# Patient Record
Sex: Female | Born: 1950 | Race: White | Hispanic: No | Marital: Married | Smoking: Former smoker
Health system: Southern US, Community
[De-identification: ages and names within clinical notes are randomized; demographics above are authoritative.]

## PROBLEM LIST (undated history)

## (undated) DIAGNOSIS — R0602 Shortness of breath: Secondary | ICD-10-CM

## (undated) DIAGNOSIS — J449 Chronic obstructive pulmonary disease, unspecified: Secondary | ICD-10-CM

## (undated) DIAGNOSIS — E039 Hypothyroidism, unspecified: Secondary | ICD-10-CM

## (undated) DIAGNOSIS — E7800 Pure hypercholesterolemia, unspecified: Secondary | ICD-10-CM

## (undated) DIAGNOSIS — K429 Umbilical hernia without obstruction or gangrene: Secondary | ICD-10-CM

## (undated) DIAGNOSIS — H9190 Unspecified hearing loss, unspecified ear: Secondary | ICD-10-CM

## (undated) DIAGNOSIS — I10 Essential (primary) hypertension: Secondary | ICD-10-CM

## (undated) DIAGNOSIS — Z720 Tobacco use: Secondary | ICD-10-CM

## (undated) DIAGNOSIS — M1612 Unilateral primary osteoarthritis, left hip: Secondary | ICD-10-CM

## (undated) DIAGNOSIS — R918 Other nonspecific abnormal finding of lung field: Secondary | ICD-10-CM

## (undated) DIAGNOSIS — R0600 Dyspnea, unspecified: Secondary | ICD-10-CM

## (undated) HISTORY — DX: Tobacco use: Z72.0

## (undated) HISTORY — PX: EYE SURGERY: SHX253

## (undated) HISTORY — DX: Other nonspecific abnormal finding of lung field: R91.8

## (undated) HISTORY — DX: Chronic obstructive pulmonary disease, unspecified: J44.9

## (undated) HISTORY — DX: Essential (primary) hypertension: I10

## (undated) HISTORY — PX: COLONOSCOPY: SHX174

---

## 1992-10-11 HISTORY — PX: TUBAL LIGATION: SHX77

## 2006-10-11 DIAGNOSIS — C801 Malignant (primary) neoplasm, unspecified: Secondary | ICD-10-CM

## 2006-10-11 DIAGNOSIS — R918 Other nonspecific abnormal finding of lung field: Secondary | ICD-10-CM

## 2006-10-11 HISTORY — DX: Other nonspecific abnormal finding of lung field: R91.8

## 2006-10-11 HISTORY — DX: Malignant (primary) neoplasm, unspecified: C80.1

## 2007-07-20 ENCOUNTER — Ambulatory Visit: Payer: Self-pay | Admitting: Thoracic Surgery

## 2007-07-24 ENCOUNTER — Ambulatory Visit (HOSPITAL_COMMUNITY): Admission: RE | Admit: 2007-07-24 | Discharge: 2007-07-24 | Payer: Self-pay | Admitting: Thoracic Surgery

## 2007-07-31 ENCOUNTER — Encounter: Payer: Self-pay | Admitting: Thoracic Surgery

## 2007-07-31 ENCOUNTER — Ambulatory Visit: Payer: Self-pay | Admitting: Thoracic Surgery

## 2007-07-31 ENCOUNTER — Inpatient Hospital Stay (HOSPITAL_COMMUNITY): Admission: AD | Admit: 2007-07-31 | Discharge: 2007-08-11 | Payer: Self-pay | Admitting: Thoracic Surgery

## 2007-07-31 HISTORY — PX: OTHER SURGICAL HISTORY: SHX169

## 2007-08-04 ENCOUNTER — Encounter: Payer: Self-pay | Admitting: Thoracic Surgery

## 2007-08-04 HISTORY — PX: OTHER SURGICAL HISTORY: SHX169

## 2007-08-14 ENCOUNTER — Ambulatory Visit: Payer: Self-pay | Admitting: Thoracic Surgery (Cardiothoracic Vascular Surgery)

## 2007-08-14 ENCOUNTER — Encounter
Admission: RE | Admit: 2007-08-14 | Discharge: 2007-08-14 | Payer: Self-pay | Admitting: Thoracic Surgery (Cardiothoracic Vascular Surgery)

## 2007-08-18 ENCOUNTER — Encounter: Admission: RE | Admit: 2007-08-18 | Discharge: 2007-08-18 | Payer: Self-pay | Admitting: Cardiothoracic Surgery

## 2007-08-18 ENCOUNTER — Ambulatory Visit: Payer: Self-pay | Admitting: Cardiothoracic Surgery

## 2007-08-23 ENCOUNTER — Encounter: Admission: RE | Admit: 2007-08-23 | Discharge: 2007-08-23 | Payer: Self-pay | Admitting: Thoracic Surgery

## 2007-08-23 ENCOUNTER — Ambulatory Visit: Payer: Self-pay | Admitting: Thoracic Surgery

## 2007-08-24 ENCOUNTER — Encounter: Admission: RE | Admit: 2007-08-24 | Discharge: 2007-08-24 | Payer: Self-pay | Admitting: Thoracic Surgery

## 2007-08-24 ENCOUNTER — Ambulatory Visit: Payer: Self-pay | Admitting: Thoracic Surgery

## 2007-08-30 ENCOUNTER — Ambulatory Visit: Payer: Self-pay | Admitting: Thoracic Surgery

## 2007-08-30 ENCOUNTER — Encounter: Admission: RE | Admit: 2007-08-30 | Discharge: 2007-08-30 | Payer: Self-pay | Admitting: Thoracic Surgery

## 2007-09-20 ENCOUNTER — Ambulatory Visit: Payer: Self-pay | Admitting: Thoracic Surgery

## 2007-09-20 ENCOUNTER — Encounter: Admission: RE | Admit: 2007-09-20 | Discharge: 2007-09-20 | Payer: Self-pay | Admitting: Thoracic Surgery

## 2007-10-18 ENCOUNTER — Encounter: Admission: RE | Admit: 2007-10-18 | Discharge: 2007-10-18 | Payer: Self-pay | Admitting: Thoracic Surgery

## 2007-10-18 ENCOUNTER — Ambulatory Visit: Payer: Self-pay | Admitting: Thoracic Surgery

## 2008-01-17 ENCOUNTER — Ambulatory Visit: Payer: Self-pay | Admitting: Thoracic Surgery

## 2008-01-17 ENCOUNTER — Encounter: Admission: RE | Admit: 2008-01-17 | Discharge: 2008-01-17 | Payer: Self-pay | Admitting: Thoracic Surgery

## 2008-05-21 ENCOUNTER — Encounter: Admission: RE | Admit: 2008-05-21 | Discharge: 2008-05-21 | Payer: Self-pay | Admitting: Thoracic Surgery

## 2008-05-21 ENCOUNTER — Ambulatory Visit: Payer: Self-pay | Admitting: Thoracic Surgery

## 2008-11-03 IMAGING — CR DG CHEST 2V
2 series · 2 of 2 positions shown · non-contrast
Comparison: PET CT 07/24/2007

CLINICAL DATA: Pre-op for bronchoscopy 07/31/2007.  Left lung mass.   
 CHEST - 2 VIEW:

[w chest pa]
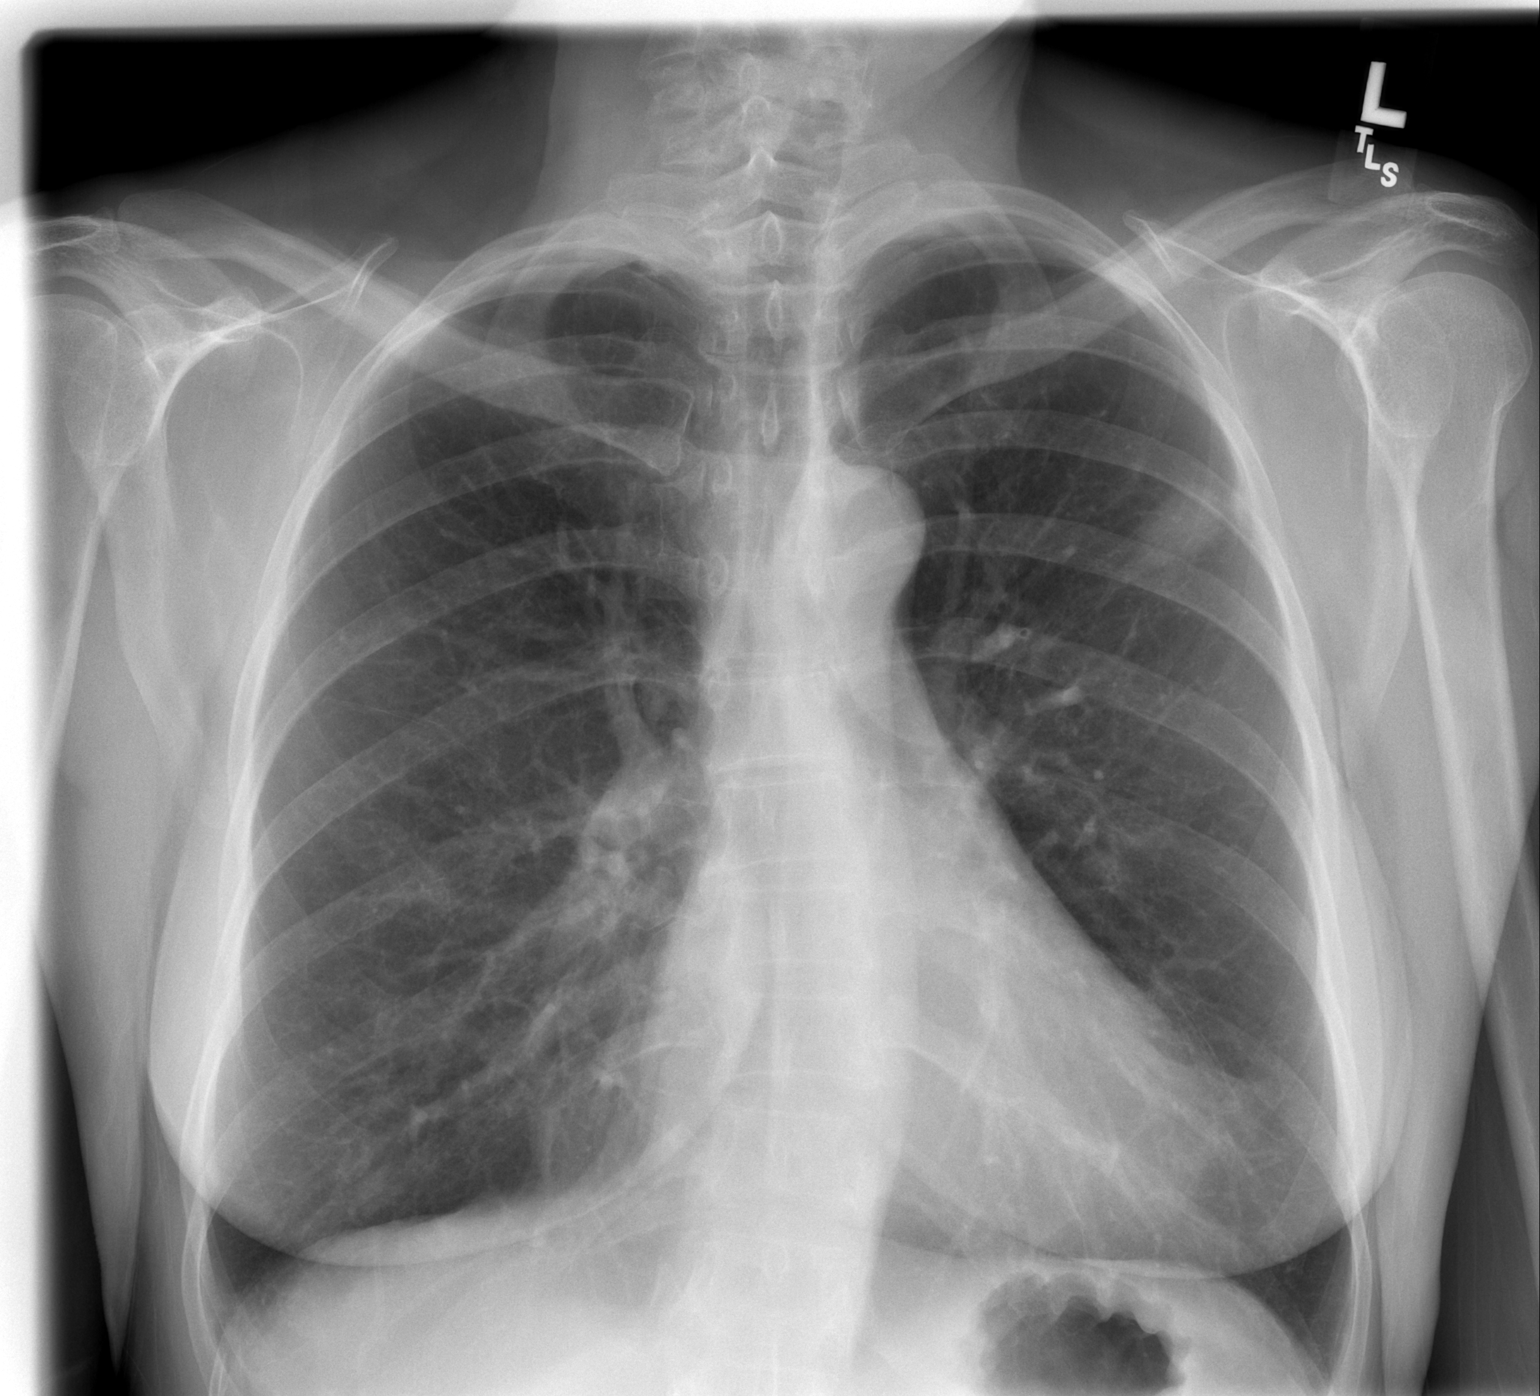

[w chest lat]
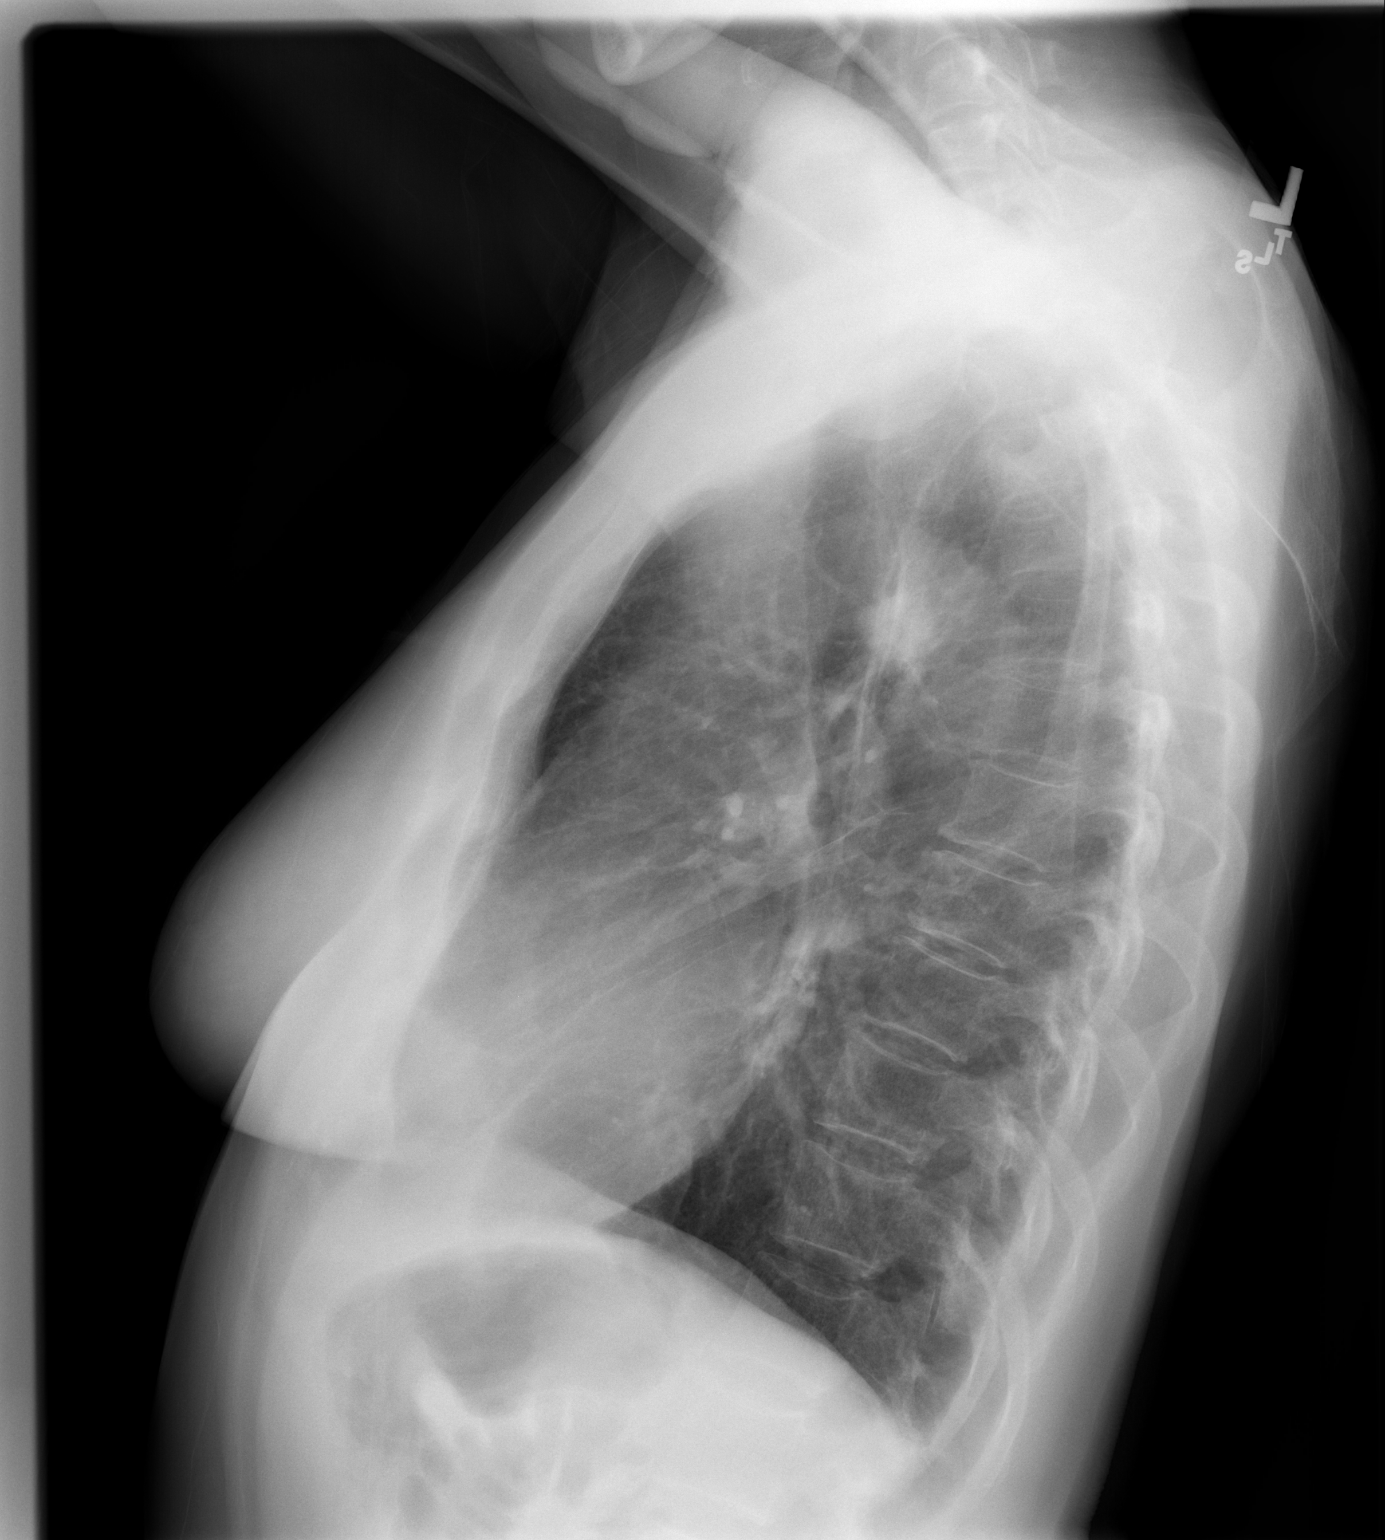

[2 of 2 positions shown; findings below may reference images not displayed]

FINDINGS: There is a left upper lobe pulmonary nodule measuring 2.3 cm in diameter.  This appears stable from the recent PET CT.  No other nodules are seen.  There is no confluent airspace opacity.  The cardiomediastinal contours are normal.  There is no pleural effusion.
IMPRESSION: No acute chest findings.  Known left upper lobe pulmonary nodule.

## 2008-11-07 IMAGING — CR DG CHEST 1V PORT
1 series · 1 of 1 positions shown · non-contrast
Comparison: 07/31/07 earlier study.

CLINICAL DATA: Status post left VATS.  Postoperative.
 PORTABLE CHEST - 1 VIEW, 07/31/07, 1122 HOURS:

[view not recorded]
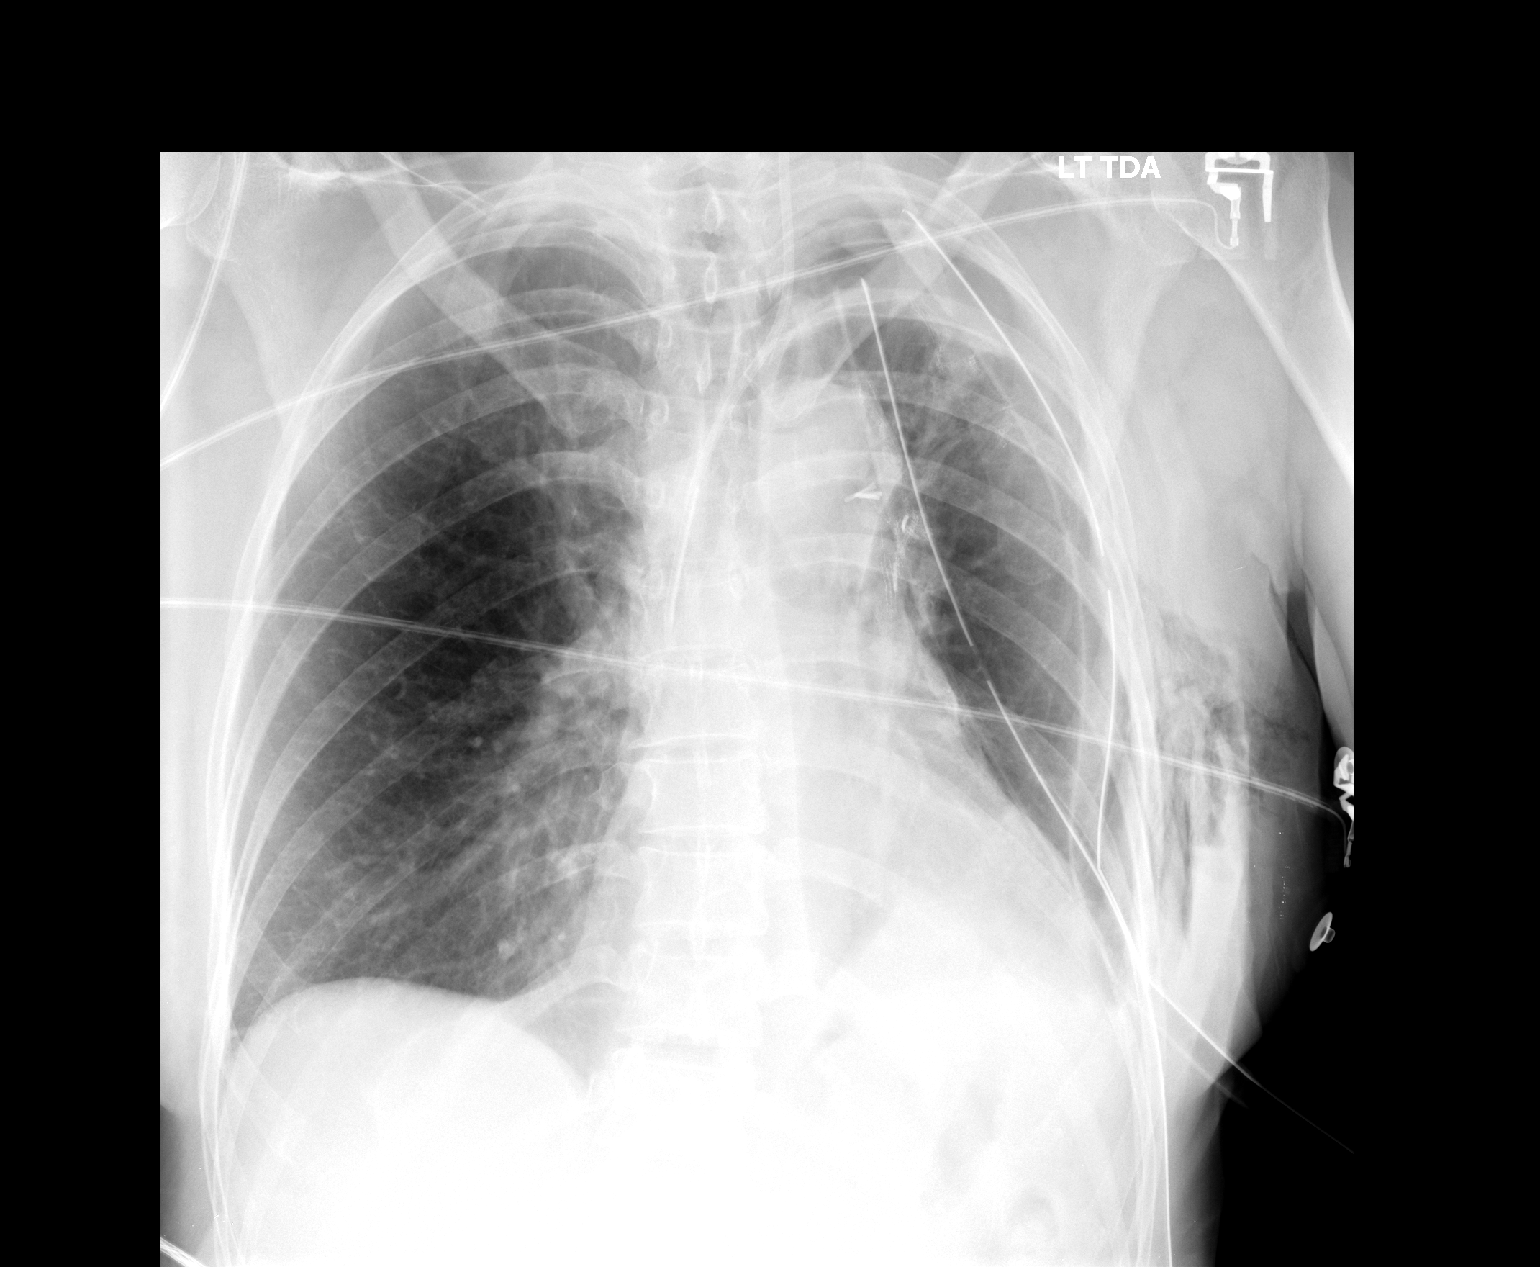

[1 of 1 positions shown; findings below may reference images not displayed]

FINDINGS: There is an approximate 10% left apical pneumothorax present.  Left sided chest tubes have been placed following left VATS.  These appear in satisfactory position.  There are mild left basilar atelectatic changes.  The right lung is clear.  Left internal jugular vein central venous catheter tip is within the superior vena cava.
IMPRESSION: Small (5-10%) left apical pneumothorax.  Mild left basilar atelectasis.

## 2008-11-08 IMAGING — CR DG CHEST 1V PORT
1 series · 1 of 1 positions shown · non-contrast
Comparison: 07/31/07.

CLINICAL DATA: Postop from thoracotomy for left upper lobe mass.  Follow-up pneumothorax.  
 PORTABLE CHEST - 1 VIEW 08/01/07:

[view not recorded]
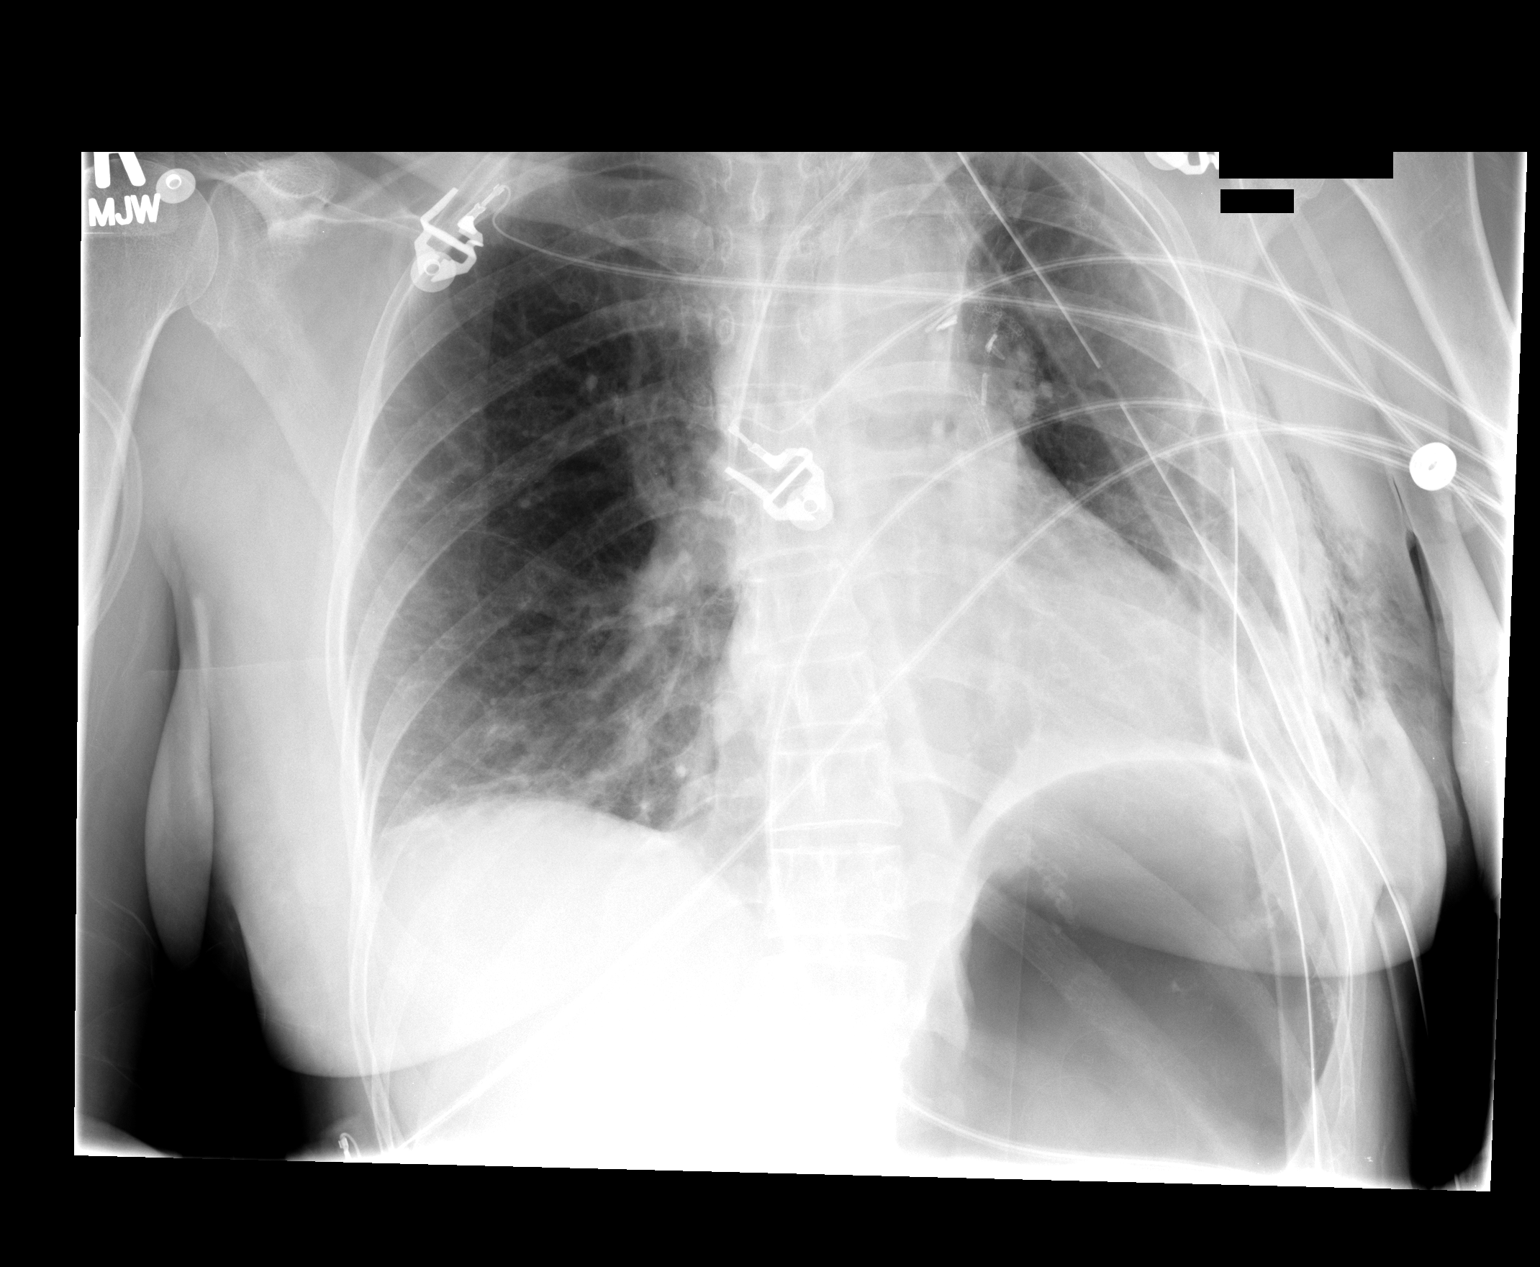

[1 of 1 positions shown; findings below may reference images not displayed]

FINDINGS: Postoperative changes from upper lobectomy are again seen.  There is a tiny less than 5% left apical pneumothorax which has decreased in size.  Left chest tubes remain in place.  Mild atelectasis at right lung base has increased since prior study.  Heart size and mediastinal contours are stable.
IMPRESSION: 1.  Decreased tiny less than 5% left apical pneumothorax. 
 2.  Mild increase in right basilar atelectasis.

## 2008-11-09 IMAGING — CR DG CHEST 1V PORT
1 series · 1 of 1 positions shown · non-contrast
Comparison: 08/01/07.

CLINICAL DATA: Left upper lung mass. 
 PORTABLE CHEST - 1 VIEW, 08/02/07, 7667 HOURS:

[view not recorded]
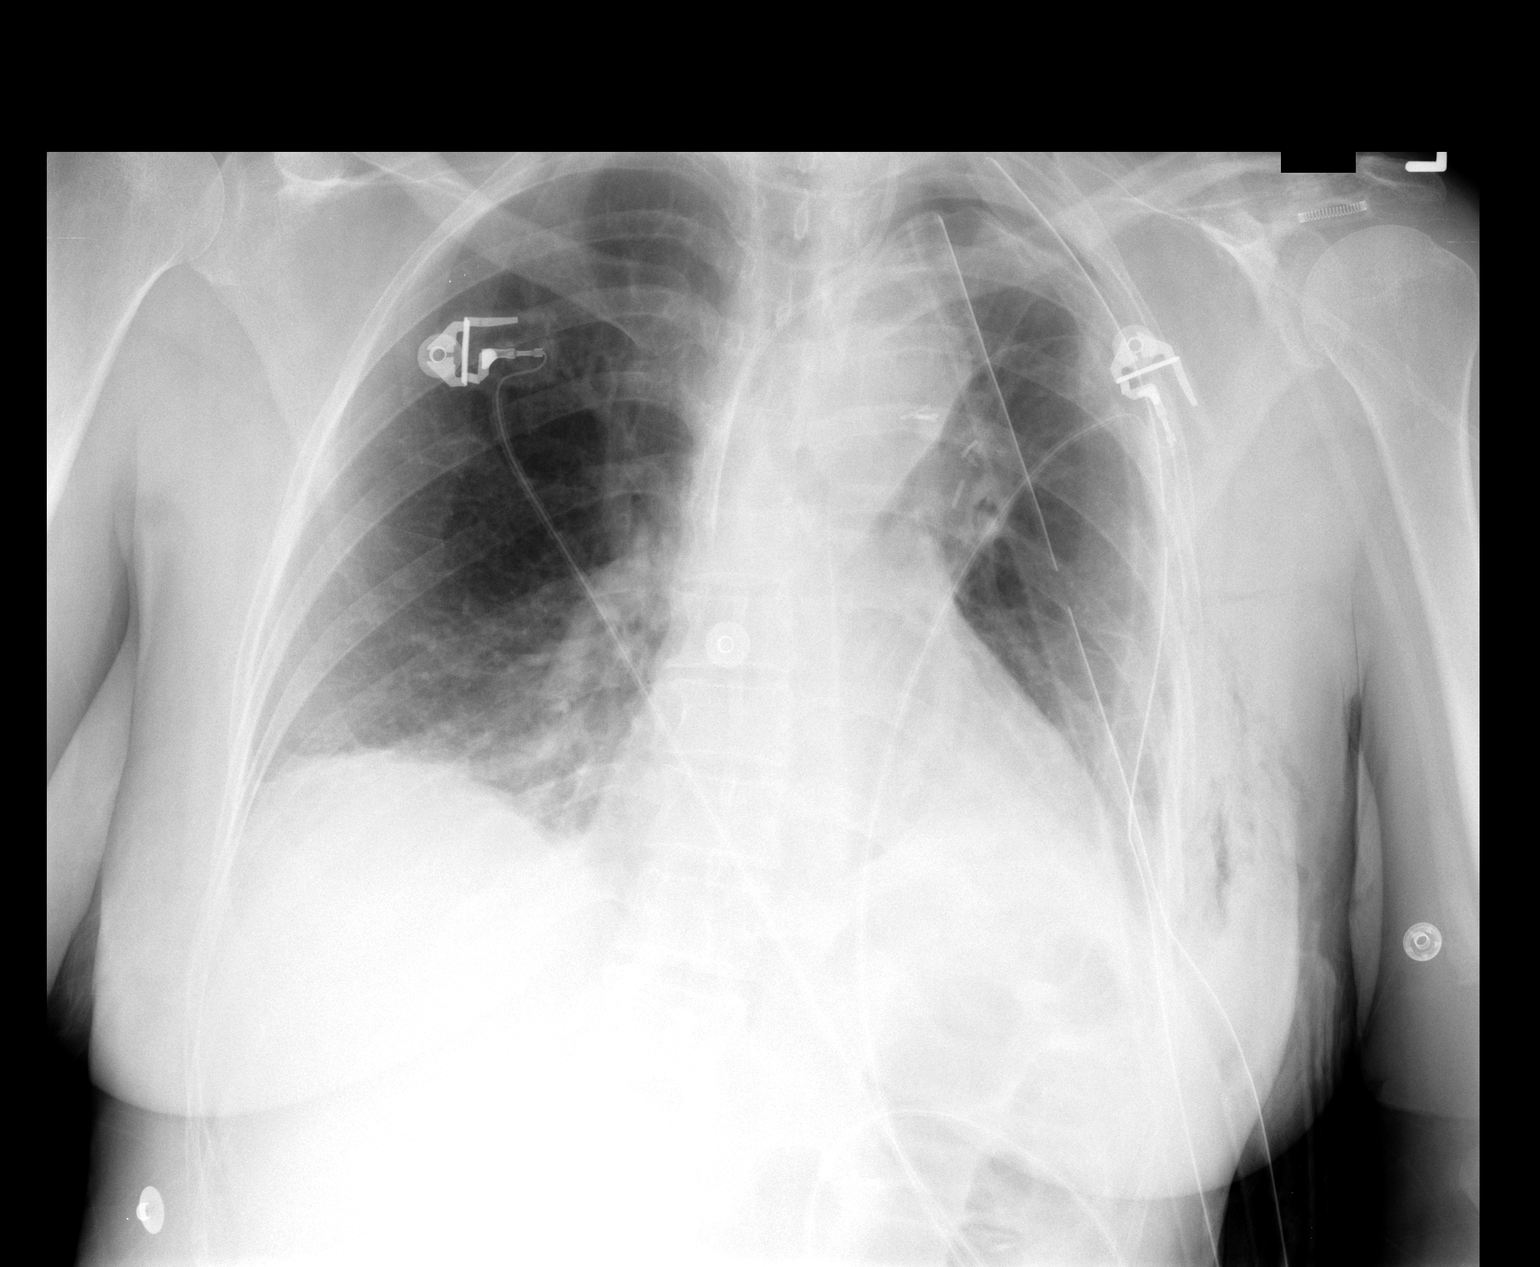

[1 of 1 positions shown; findings below may reference images not displayed]

FINDINGS: The left IJ vein central venous catheter and 2 left chest tubes are stable.  A left apical pneumothorax has slightly increased and is 5%.  Bibasilar atelectasis is stable.  The heart is mildly enlarged.  Gastric distention has improved.  Pulmonary vascularity is within normal limits.
IMPRESSION: Left pneumothorax slightly larger to 5%.  Otherwise, stable.

## 2008-11-12 IMAGING — CR DG CHEST 1V PORT
1 series · 1 of 1 positions shown · non-contrast
Comparison: 08/04/07
 Reexpansion of majority of the left lung noted.

CLINICAL DATA: Left upper lobe mass resection.  Sepsis.
 PORTABLE CHEST- 1 VIEW:

[view not recorded]
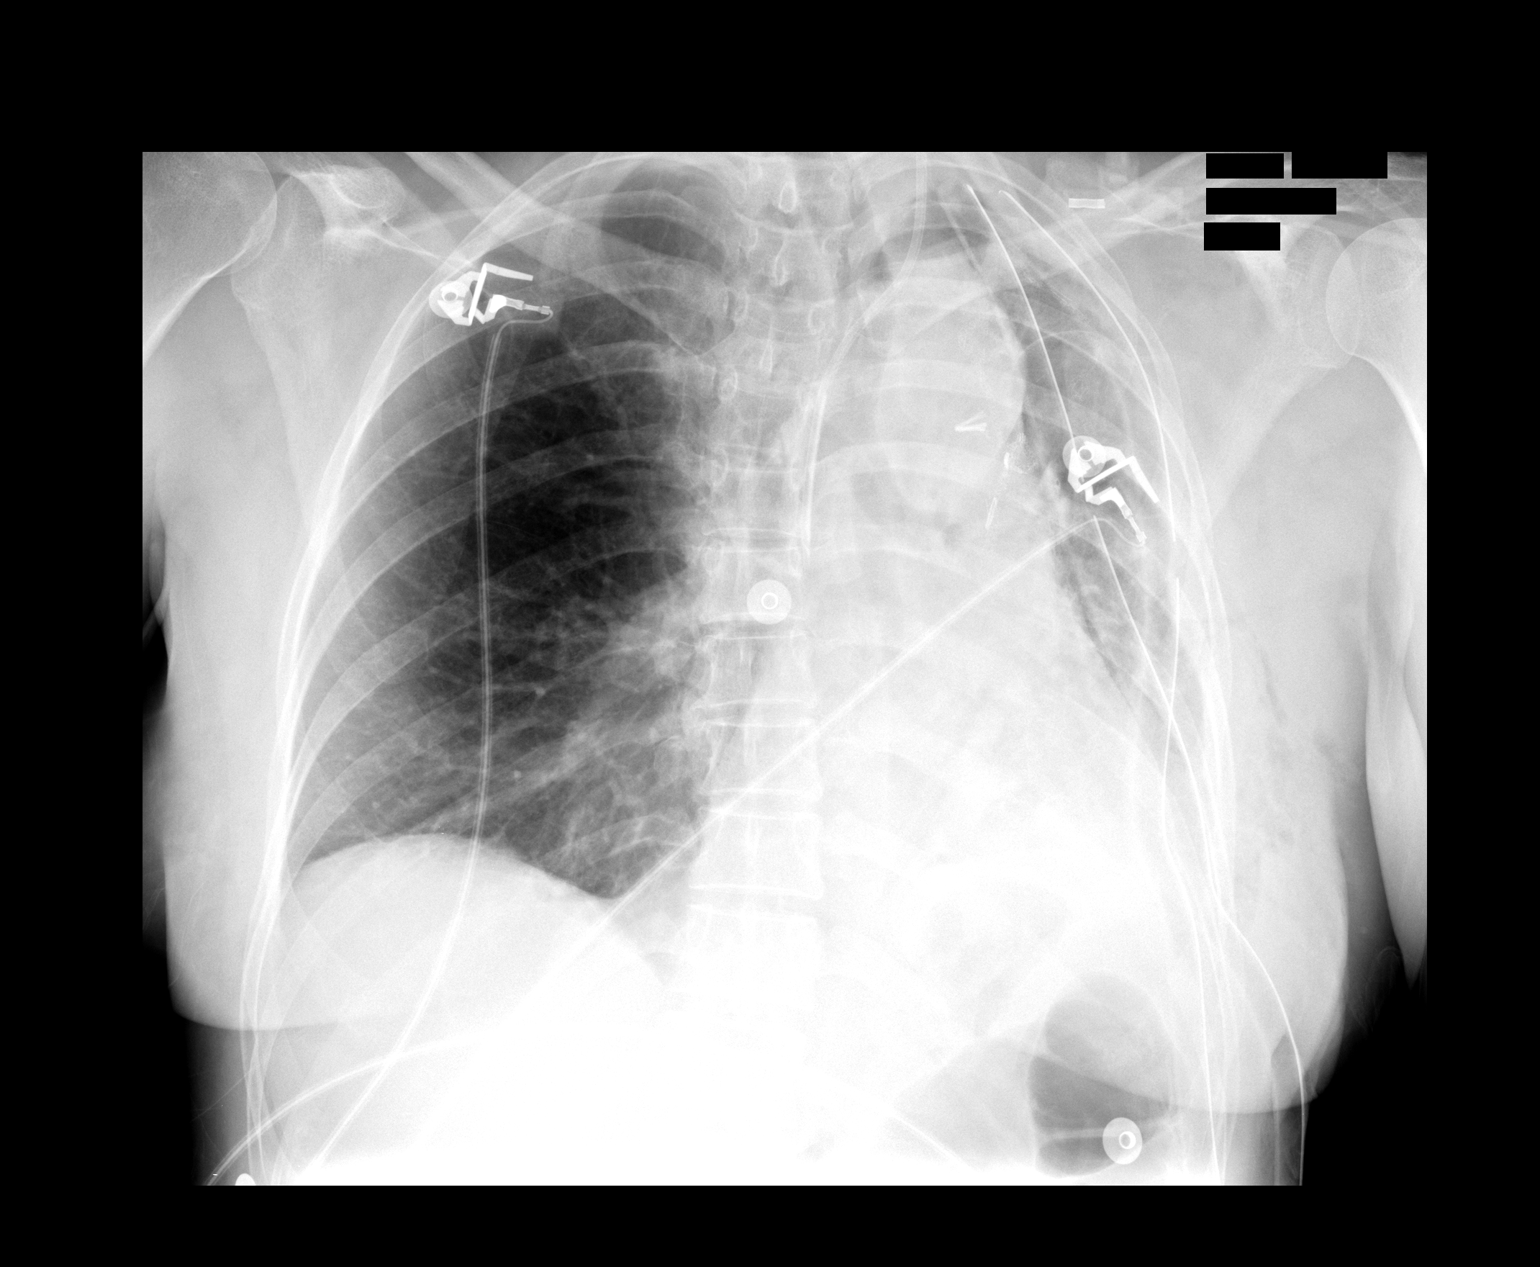

[1 of 1 positions shown; findings below may reference images not displayed]

Decreased left lung atelectasis.  Two left thoracostomy tubes and left central venous catheter are unchanged.  Evidence of left upper lobectomy is noted.  No definite pneumothorax is present.
IMPRESSION: Reexpanded left lung with decreased left atelectasis.

## 2008-11-27 ENCOUNTER — Ambulatory Visit: Payer: Self-pay | Admitting: Thoracic Surgery

## 2008-11-27 ENCOUNTER — Encounter: Admission: RE | Admit: 2008-11-27 | Discharge: 2008-11-27 | Payer: Self-pay | Admitting: Thoracic Surgery

## 2009-06-04 ENCOUNTER — Ambulatory Visit: Payer: Self-pay | Admitting: Thoracic Surgery

## 2009-06-04 ENCOUNTER — Ambulatory Visit (HOSPITAL_COMMUNITY): Admission: RE | Admit: 2009-06-04 | Discharge: 2009-06-04 | Payer: Self-pay | Admitting: Thoracic Surgery

## 2009-12-02 ENCOUNTER — Encounter: Admission: RE | Admit: 2009-12-02 | Discharge: 2009-12-02 | Payer: Self-pay | Admitting: Thoracic Surgery

## 2009-12-02 ENCOUNTER — Ambulatory Visit: Payer: Self-pay | Admitting: Thoracic Surgery

## 2010-08-04 ENCOUNTER — Encounter: Admission: RE | Admit: 2010-08-04 | Discharge: 2010-08-04 | Payer: Self-pay | Admitting: Thoracic Surgery

## 2010-08-04 ENCOUNTER — Ambulatory Visit: Payer: Self-pay | Admitting: Thoracic Surgery

## 2010-08-18 ENCOUNTER — Encounter: Admission: RE | Admit: 2010-08-18 | Discharge: 2010-08-18 | Payer: Self-pay | Admitting: Thoracic Surgery

## 2010-11-01 ENCOUNTER — Encounter: Payer: Self-pay | Admitting: Thoracic Surgery

## 2011-02-23 NOTE — Assessment & Plan Note (Signed)
OFFICE VISIT   Cindy Bullock, Cindy Bullock  DOB:  21-Apr-1951                                        September 20, 2007  CHART #:  [id number]   The patient came for follow-up today.  Her chest x-ray is stable.  She  is having some nightsweats and some hot flashes.  I am not sure what is  causing this, but she has been far enough out that we can stop the  amiodarone.  She has remained in normal sinus rhythm.  She stopped her  Celebrex.  She is doing well overall.  She is a stage IA nonsmall cell  lung cancer.  I also told her to stop her Protonix.  She will continue  on the Toprol once a day.   PHYSICAL EXAMINATION:  VITAL SIGNS:  Blood pressure 139/84, pulse 68,  respirations 22, and saturations were 96%.  Incisions were well-healed.   FOLLOWUP:  She will see me back again in four weeks for a chest x-ray.  Return to work on January 6.  I will also see about getting her to an  oncologist if that is what she desires.   [name], M.D.  Electronically Signed   DPB/MEDQ  D:  09/20/2007  T:  09/20/2007  Job:  [id number]

## 2011-02-23 NOTE — Letter (Signed)
July 20, 2007   Dr. Renaldo Harrison  [facility]  [address] [place], [place]  [address]   Re:  Cindy Bullock, CUADRA                  DOB:  01-09-1951   Dear Dr. Hart Robinsons:   This 60 year old patient has a long history of smoking and is not  inclined to resolve.  She went to the [facility] with a questionable URI  and got a chest x-ray which showed a left upper lobe mass.  A CT scan  was done and showed a left upper lobe mass with some nodular thickening  of the adrenal gland and some mild adenopathy.  Pulmonary function test  here shows an FVC of 1.58 with an FEV 1.62.  However, her performance  status seems to be better than this.  She was also known to have  bilateral breast masses, but has been followed with yearly mammography.  The left upper lobe mass is 1.8 x 1.8 speculated mass in the outer  segment of the left upper lobe.  There is no mediastinal adenopathy.  She has had no hemoptysis, fever, chills, excessive sputum or weight  loss.   PAST MEDICAL HISTORY:  1. She has no allergies  2. She had a previous left frozen shoulder and osteopenia.   MEDICATIONS:  1. Fosamax 70 mg daily.  2. Activella.   FAMILY HISTORY:  Positive for cerebrovascular disease and diabetes.   SOCIAL HISTORY:  She works for [employer], has one child.  She is married.  Smokes one pack of cigarettes a day, is trying to quit.  Does not drink alcohol on a regular basis.   REVIEW OF SYSTEMS:  CARDIAC:  She has no anterior atrial fibrillation.  PULMONARY:  No bronchitis or hemoptysis.  GI:  No GERD, hiatal hernia,  dysphagia, abdominal pain.  GU:  No kidney disease, dysuria, frequent  urination.  VASCULAR:  No claudication, DVT, TIAs.  NEUROLOGICAL:  No  dizziness, headaches, blackouts or seizures.  MUSCULOSKELETAL:  No  arthritis, joint pain, muscle pain.  PSYCHIATRIC:  No psychiatric  illness. EYE/ENT:  No change in eyesight or hearing.  HEMATOLOGIC:  No  problems with  bleeding or clotting disorders or anemia.   PHYSICAL EXAMINATION:  GENERAL:  She is a well-developed, Caucasian  female in no acute distress.  VITAL SIGNS:  Blood pressure is 156/87, pulse 72, respirations 18,  saturation 97%.  HEENT:  Head is atraumatic.  Eyes:  Pupils equal, round and reactive to  light and accommodation.  Extraocular movements normal.  Ears:  Tympanic  membranes are intact.  Nose:  There is no septal deviation.  Throat:  Without lesion.  NECK:  Supple without thyromegaly.  No supraclavicular or axially  adenopathy.  CHEST:  Clear to auscultation and percussion, although, I did hear a  fake wheeze.  HEART:  Regular sinus rhythm.  No murmurs.  ABDOMEN:  Soft.  No hepatosplenomegaly .  EXTREMITIES:  Pulses are 2+.  There is no clubbing or edema.  NEUROLOGICAL:  She is oriented x3.  Sensory and motor intact.   ASSESSMENT:  I feel that she probably has at least a stage IB nonsmall  cell lung cancer.  Recommend that she get a PET scan, brain scan and a  full set of pulmonary function test including diffusion capacity.  We  will see her back after that and decide whether we need to do a biopsy  or recommend doing a VATS lobectomy.   I appreciate the opportunity of seeing Cindy Bullock.   [name], M.D.  Electronically Signed   DPB/MEDQ  D:  07/20/2007  T:  07/21/2007  Job:  [id number]

## 2011-02-23 NOTE — Assessment & Plan Note (Signed)
OFFICE VISIT   SHAWNE, BULOW  DOB:  1951/05/03                                        June 04, 2009  CHART #:  [id number]   The patient returns today.  Her blood pressure is 157/89, pulse 80,  respirations 18, sats were 98%.  Her health has been stable.  A chest CT  scan today shows no evidence of recurrence of her cancer, although we do  not have the final report.  We will see her back again in 6 months with  a chest x-ray and call her if there is any difference on the final  report.   [name], M.D.  Electronically Signed   DPB/MEDQ  D:  06/04/2009  T:  06/05/2009  Job:  [id number]

## 2011-02-23 NOTE — Assessment & Plan Note (Signed)
OFFICE VISIT   BLIA, TOTMAN  DOB:  Feb 15, 1951                                        December 02, 2009  CHART #:  [id number]   She returns today.  She is now approximately over 2 years since her  lobectomy for surgery.  She had a left upper lobectomy for cancer.  Chest x-ray today shows normal postoperative change.  She is doing well  overall.  We plan to see her back again in October, which would be 3  years for a CT scan and we may then refer her back to her medical doctor  at that time.  Her blood pressure is 176/87, pulse 78, respirations 18,  sats were 92%.   Cindy Bullock, M.D.  Electronically Signed   DPB/MEDQ  D:  12/02/2009  T:  12/03/2009  Job:  [id number]

## 2011-02-23 NOTE — Assessment & Plan Note (Signed)
OFFICE VISIT   MALASIA, TORAIN  DOB:  Aug 10, 1951                                        August 30, 2007  CHART #:  [id number]   Cindy Bullock came for follow up today.  Her chest x-ray still shows the  apical space and incisions are well healed.  Lungs are clear to  auscultation and percussion.  Heart is regular sinus rhythm.  She still  has some stiffness.  We told her to continue on the Celebrex.  Plan to  see her back again in 3 weeks with the chest x-ray, gave her release,  return to work on January 6.   Cindy Bullock, M.D.  Electronically Signed   DPB/MEDQ  D:  08/30/2007  T:  08/31/2007  Job:  [id number]

## 2011-02-23 NOTE — Assessment & Plan Note (Signed)
OFFICE VISIT   Cindy Bullock, Cindy Bullock  DOB:  1951/03/10                                        August 04, 2010  CHART #:  [id number]   The patient comes today for followup of her lung cancer and her CT scan  showed no evidence of recurrence.  The CT scan did say there is some  more nodularity in her breasts and recommend a mammogram, and we went  ahead and ordered a mammogram since it has been 2 years since the last  mammogram she had in [place].  If that is negative, we will see her  back again in 1 year with another CT scan, it will be over 4 years since  her surgery.  Her blood pressure was 181/87, pulse 64, respirations 18,  and sats were 99%.  Lungs were clear to auscultation and percussion.   [name], M.D.  Electronically Signed   DPB/MEDQ  D:  08/04/2010  T:  08/05/2010  Job:  [id number]

## 2011-02-23 NOTE — H&P (Signed)
Cindy Bullock, DIVELEY                ACCOUNT NO.:  [id number]   MEDICAL RECORD NO.:  [id number]          PATIENT TYPE:  INP   LOCATION:  2550                         FACILITY:  [facility]   PHYSICIAN:  [name], M.D. DATE OF BIRTH:  Feb 03, 1951   DATE OF ADMISSION:  07/31/2007  DATE OF DISCHARGE:                              HISTORY & PHYSICAL   CHIEF COMPLAINT:  Left lung mass.   HISTORY OF PRESENT ILLNESS:  This 60 year old patient has a long history  of smoking and has tried to quit.  Had a questionable upper respiratory  infection.  Went to primary care in [place], which showed a left upper  lobe mass.  A CT scan showed a left upper lobe mass with nodular  thickening of the adrenal gland.  He was referred for evaluation.  Pulmonary function test showing FVC of 1.58, with an FEV of 101.62.  Repeat pulmonary function test showed an FVC of 3.01 with an FEV1 of  2.45, and an effusion opacity of 96%.  A PET scan was done, which showed  no evidence of spread, but increased uptake in the 1.8 x 1.8 spiculated  mass.  She has had no hemoptysis, fever or chills, excessive sputum or  weight loss.   ALLERGIES:  No allergies.   PAST MEDICAL HISTORY:  She had a history of frozen left shoulder with  osteopenia.   MEDICATIONS:  She is on Fosamax __________  , estradiol TTS  patch, and medroxyprogesterone ACTB patch at 2.5 mg, as  well as Activella.   FAMILY HISTORY:  Positive for diabetes and cerebrovascular disease.   SOCIAL HISTORY:  She works in the [employer].  She has one  child.  She is married.  She smokes 1 to 5 cigarettes a day; is trying  to quit.  She does not drink alcohol on a regular basis.   REVIEW OF SYSTEMS:  GENERAL:  Her weight has been stable.  CARDIAC:  No  angina or atrial fibrillation.  PULMONARY:  See history of present  illness.  GI:  No GERD, hiatal hernia, dysphagia, abdominal pain.  GU:  No kidney disease, dysuria or frequent urination.   VASCULAR:  No  claudication, DVT, TIAs.  NEUROLOGICAL:  No dizziness, headaches,  blackouts or seizures.  MUSCULOSKELETAL:  No arthritis, joint pain or  rash.  PSYCHIATRIC:  No __________  history of depression.  EYES/ENT:  No change in her eyesight or hearing.  HEMATOLOGICAL:  No problems with  bleeding or clotting disorders, or anemia.   PHYSICAL EXAMINATION:  GENERAL:  She is a well-developed, Caucasian  female in no acute distress.  VITAL SIGNS:  Her blood pressure is 150/80, pulse 80, respirations 18,  sats were 97%.  HEENT:  Head is atraumatic.  Eyes:  Pupils equal,  reactive to light and  accommodation.  Extraocular movements are normal.  Ears:  Tympanic  membranes are intact.  Nose:  There is no septal deviation.  Throat is  without lesion.  NECK:  Supple without thyromegaly.  There is no supraclavicular or  axillary adenopathy.  No jugular venous  distention.  CHEST:  Clear to auscultation and percussion.  HEART:  Regular sinus rhythm, no murmurs.  ABDOMEN:  Soft.  There is no hepatosplenomegaly.  Bowel sounds normal.  EXTREMITIES:  Pulses 2+.  There is no clubbing or edema.  NEUROLOGICAL:  She is oriented x3.  Sensory and motor intact.  Cranial  nerves intact.  SKIN:  Without lesion.   IMPRESSION:  1. Left upper lobe mass, probably stage IB nonsmall-cell lung cancer.  2. History of tobacco abuse.  3. Chronic obstructive pulmonary disease.   PLAN:  Left upper lobectomy.      [name], M.D.  Electronically Signed     DPB/MEDQ  D:  07/31/2007  T:  07/31/2007  Job:  [id number]

## 2011-02-23 NOTE — Assessment & Plan Note (Signed)
OFFICE VISIT   Cindy Bullock, Cindy Bullock  DOB:  1951-03-21                                        May 21, 2008  CHART #:  [id number]   HISTORY OF PRESENT ILLNESS:  The patient is status post left upper  lobectomy dating August 08, 2007, done by Dr. Edwyna Shell.  The patient  presents today for followup appointment with CT scan.  The patient is  doing quite well.  She still has some shortness of breath with  significant exertion, but otherwise she is stable.  She denies any  discomfort.  She denies any coughing, hemoptysis, or wheezing.   PHYSICAL EXAMINATION:  VITAL SIGNS:  Blood pressure 148/89, pulse of 80,  respirations 18, O2 sats 90% on room air.  RESPIRATORY:  Clear to auscultation bilaterally.  CARDIAC:  Regular rate and rhythm.  Incisions all healing well.   STUDIES:  The patient had a CT scan of the chest done May 21, 2008,  showing stable postoperative change on the left with no evidence of  recurrence of disease or metastatic involvement of the lungs.   IMPRESSION AND PLAN:  The patient is doing well post left upper  lobectomy, showed positive for adenocarcinoma.  The patient was seen and  evaluated by Dr. Edwyna Shell.  Dr. Edwyna Shell did evaluate the patient's CT scan.  CT scan continues to remain clear without any evidence of recurrence.  We will plan to see the patient back in 6 months with a repeat CT scan.  The patient is told in the interim if she develops any surgical issues,  she is to contact us.  The patient agrees.   [name], M.D.  Electronically Signed   KMD/MEDQ  D:  05/21/2008  T:  05/21/2008  Job:  [id number]   cc:   [name], M.D.

## 2011-02-23 NOTE — Assessment & Plan Note (Signed)
OFFICE VISIT   Cindy Bullock, Cindy Bullock  DOB:  Jun 16, 1951                                        July 26, 2007  CHART #:  [id number]   I called Cindy Bullock today and discussed her findings. Her PET scan was  just positive in the left upper lobe lesion with no evidence of spread.  Her pulmonary function test showed an FVC of 3.01 with an FEV1 of 2.45  and effusion capacity of 95%. CT scan of the head was also negative. I  have recommended that she instead of bronchoscopy, mediastinoscopy as  she was scheduled to have a left upper lobectomy and she agrees to this.  She also agrees to the MT  tissue study and I will plan to do this on  October 20, at [facility].   [name], M.D.  Electronically Signed   DPB/MEDQ  D:  07/26/2007  T:  07/27/2007  Job:  [id number]

## 2011-02-23 NOTE — Op Note (Signed)
NAMEJANEESE, Cindy Bullock                ACCOUNT NO.:  [id number]   MEDICAL RECORD NO.:  [id number]          PATIENT TYPE:  INP   LOCATION:  2315                         FACILITY:  [facility]   PHYSICIAN:  [name], M.D. DATE OF BIRTH:  Mar 21, 1951   DATE OF PROCEDURE:  07/31/2007  DATE OF DISCHARGE:                               OPERATIVE REPORT   PREOPERATIVE DIAGNOSIS:  Left upper lobe mass.   POSTOPERATIVE DIAGNOSIS:  Left upper lobe mass.   OPERATION:  Left VATS, left upper lobectomy.   SURGEON:  [name], M.D.   FIRST ASSISTANT:  Coral Ceo PA-C.   ANESTHESIA:  General.   After general anesthesia, the left chest was prepped and draped in usual  sterile manner.  The left lung was deflated.  Two trocar sites were made  in the anterior and midaxillary line at the seventh intercostal space.  Two trocars were inserted.  A 0-degree scope was inserted.  The lesion  was seen in the posterior segment of the left upper lobe.  A 7 to 8-cm  incision was made over the fourth intercostal space at the anterior  axillary line.  Dissection was carried out, partially dividing this with  latissimus muscle and splitting the serratus.  A small retractor was  used to retract the tissues, and then through this incision we dissected  out the anterior hilum, dissecting out the superior pulmonary vein, and  then using a Covidien stapler coming in using the 30-mm gray 2-mm  stapler, we stable and divided the superior pulmonary vein.  We then  dissected out the apical posterior branch, dissecting out several 10L  nodes and stapled and divided it.  A 6 and a 5 node were also dissected  free.  Attention was turned to the fissure.  The fissure was partially  divided with a 60 blue Covidien stapler and then with a 60 and 30  stapler, dissecting down to the pulmonary artery.  We resected down to  the pulmonary artery.  The inferior portion of the fissure was divided  with a Covidien stapler, and  then the lingular branch was ligated  proximally and distally with 0 silk clips and divided.  Then the  superior portion of the fissure was divided with the Covidien stapler.  That left a small anterior branch which was dissected out, doubly  clipped and divided and another small anterior branch which was clipped.  A 14 Duhamel catheter was passed around the bronchus and then used that  to guide a Covidien 45 green stapler, and this was stapled and fired.  Then the right upper lobe was placed in a bag and removed.  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  The  inferior pulmonary ligament was taken down with electrocautery.  Two  chest tubes were brought into the trocar sites and tied in place with 0  silk.  A single On-Q was placed in the usual fashion.  CoSeal was  applied to the staple line.  A Marcaine block was done in the usual  fashion.  The chest was closed with  two pericostals brought in through the fifth  and passed around the fourth rib with #1 Vicryl in the muscle layer, 2-0  Vicryl in the subcutaneous tissue, and Dermabond for the skin.   The patient was returned to the recovery room in stable condition.      [name], M.D.  Electronically Signed     DPB/MEDQ  D:  07/31/2007  T:  08/01/2007  Job:  [id number]

## 2011-02-23 NOTE — Assessment & Plan Note (Signed)
OFFICE VISIT   Cindy Bullock, Cindy Bullock  DOB:  1950/11/09                                        October 18, 2007  CHART #:  [id number]   Cindy Bullock has gone back to work. She is doing well.  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   Her blood pressure is 170/90, pulse is 98, respirations 20, saturations  were 98%.  Incision was well-healed.  Lungs were clear to auscultation  and percussion.   Told to gradually increase her activity.  I plan to see her back again  in 3 months with a chest x-ray and I will order a CT scan at 6 months.   [name], M.D.  Electronically Signed   DPB/MEDQ  D:  10/18/2007  T:  10/18/2007  Job:  [id number]

## 2011-02-23 NOTE — Discharge Summary (Signed)
Cindy Bullock, Cindy Bullock                ACCOUNT NO.:  [id number]   MEDICAL RECORD NO.:  [id number]          PATIENT TYPE:  INP   LOCATION:  2039                         FACILITY:  [facility]   PHYSICIAN:  [name], M.D. DATE OF BIRTH:  [birth year]   DATE OF ADMISSION:  07/31/2007  DATE OF DISCHARGE:                               DISCHARGE SUMMARY   FINAL DIAGNOSIS:  Left upper lobe mass, adenocarcinoma stage IA.   IN-HOSPITAL DIAGNOSES:  1. Atelectasis left lung status post left upper lobectomy.  2. Supraventricular tachycardia.   SECONDARY DIAGNOSES:  History of frozen left shoulder with osteopenia.   OPERATIONS AND PROCEDURES:  1. Left video-assisted thoracoscopic surgery with left mini      thoracotomy, left upper lobectomy with lymph node dissection.  2. Video bronchoscopy.   HISTORY AND PHYSICAL AND HOSPITAL COURSE:  The patient is a 60 year old  female who has a long history of tobacco use and has tried to quit.  She  had a questionable upper respiratory infection and went to primary care  in [place] which showed a left upper lobe mass.  A CT scan showed a  left upper lobe mass with nodular thickening of the adrenal gland. The  patient was referred for evaluation.  Pulmonary function tests done  showed an FVC of 1.58 with an FEV of 101.62.  Repeat pulmonary function  tests showed an FVC of 3.01 with an FEV-1 of 2.45 and infusion opacity  of 96%.  PET scan done showed no evidence of further increased uptake in  1.8 x 1.8 spiculated mass.  The patient denies hemoptysis, fever,  chills, excessive sputum or weight loss. She was seen and evaluated by  Dr. Edwyna Shell.  Dr. Edwyna Shell discussed with the patient undergoing left  thoracotomy with left upper lobectomy.  He discussed the risks and  benefits with the patient.  The patient acknowledged her understanding  and agreed to proceed.  Surgery was scheduled for July 31, 2007. For  details of the patient's past medical history and  physical exam please  see dictated H&P.   The patient was taken to the operating room July 21, 2007 where she  underwent left video-assisted thoracoscopic surgery with left mini  thoracotomy, left upper lobectomy and lymph node dissection.  Final  pathology showed non-small cell lung cancer, adenocarcinoma.  Final  pathology showed staging to be stage IA adenocarcinoma.  The patient's  postoperative course she was noted to be hemodynamically stable  postoperatively.  She was extubated following surgery.  Post extubation  the patient noted to be alert and oriented x4, neuro intact.  Daily  chest x-rays obtained.  The patient was noted to have a left upper lobe  pneumothorax versus space.  She had a persistent air leak, large in  size.  With discontinuation of suction, air leak increased in size.  The  patient was placed back to suction and felt best to undergo video  bronchoscopy.  Dr. Edwyna Shell discussed with the patient undergoing video  bronchoscopy.  Discussed risks and benefits.  The patient acknowledged  her understanding and agreed to proceed.  This was scheduled for August 04, 2007.  The patient was taken back to the operating room on August 04, 2007 where she underwent video bronchoscopy.  There was noted to be  some mild erythema on the right side. On the left side, there was a  small to moderate amount of secretions.  These were irrigated out  copiously and sent for culture.  Under fluoro the lung was evaluated and  did come up but not completely.  The patient's chest tubes were placed  back to suction.  She was then transferred back to 3300 following video  bronchoscopy.  She tolerated this procedure well.  Chest x-rays were  done daily.  The patient continued to have a left upper lobe space  versus pneumothorax.  She also continued to have a small air leak noted.  The patient's pleura vac was changed to [place].  On August 10, 2007 the  patient continued to have a small  air leak but chest x-ray remained  stable.  Currently plan to send the patient home with a Mini Express.  Will apply Mini Express later today versus a.m. Plan to obtain chest x-  ray prior to discharge.  During the patient's postoperative course  following the video bronchoscopy, she did develop supraventricular  tachycardia.  The patient was started on amiodarone as well as  Lopressor.  After starting these two medications, the patient converted  back to normal sinus rhythm.  The patient remained in normal sinus  rhythm the remainder of her postoperative course.  She remained on  amiodarone and Lopressor. All vital signs remained stable.  The patient  also complained of vaginal pruritus and thought she developed a yeast  infection.  She was given Diflucan p.o. as well as placed on Nystatin  cream.  This did improve prior to discharge.  On August 10, 2007, the  patient's vital signs noted to be stable.  She was afebrile.  She was  sating greater than 90% on room air.  Chest x-ray remained unchanged.  She continued to have persistent small air leak.  Pulmonary status was  stable.  She was in normal sinus rhythm.  Incisions were all clean, dry  and intact and healing well.  She was out of bed ambulating well without  difficulty on her own.  She was tolerating diet well, no nausea or  vomiting noted.   Labs October 25th showed a white count of 8.0, hemoglobin of 11.9,  hematocrit  of 35.6, platelet count 169.  Sodium of 140, potassium 3.8,  chloride of 104, bicarb of 29, BUN 4, creatinine 0.61, glucose 114.   The patient is tentatively ready for discharge home in the a.m. August 11, 2007.  The plan will be to discharge the patient with a chest tube  and mini express and arrange home health nurse for chest tube dressing  and management.   FOLLOW-UP APPOINTMENTS:  Follow-up appointment has been arranged with  Dr. Edwyna Shell for August 24, 2007 at 3:45 p.m.  The patient will need to   obtain a PA and lateral chest x-ray 30 minutes prior to this  appointment.   ACTIVITY:  The patient is instructed no driving until released to do so,  no lifting over 10 pounds.  She is told to ambulate 3-4 times per day,  progress as tolerated and to continue her breathing exercises.   INCISIONAL CARE:  The patient is told to shower washing her incisions  using soap and water.  She is contact the office if she develops any  drainage or opening from any of her incision sites.   DIET:  The patient educated on diet to be low-fat, low-salt.   DISCHARGE MEDICATIONS:  1. Protonix 40 mg daily.  2. Amiodarone 200 mg b.i.d.  3. Toprol XL 25 mg daily.  4. Percocet 5/325 1-2 tablets q. 4-6 hours p.r.n. pain.  5. Estradiol 0.05 mg weekly.  6. Fosamax weekly.  7. Medroxyprogesterone acetate 2.5 mg daily.      Theda Belfast, [place]      [name], M.D.  Electronically Signed    KMD/MEDQ  D:  08/10/2007  T:  08/11/2007  Job:  [id number]

## 2011-02-23 NOTE — Assessment & Plan Note (Signed)
OFFICE VISIT   Cindy Bullock, Cindy Bullock  DOB:  June 29, 1951                                        November 27, 2008  CHART #:  [id number]   The patient came today now 18 months since her surgery.  Her blood  pressure is 132/98, pulse 83, respirations 18, and sats are 98%.  Lungs  are clear to auscultation and percussion.  Heart regular sinus rhythm.  No murmurs.  Her chest x-ray showed no evidence of recurrence.  She had  no supraclavicular or axillary adenopathy.  I will plan to see her back  in 6 months with a CT scan.   [name], M.D.  Electronically Signed   DPB/MEDQ  D:  11/27/2008  T:  11/27/2008  Job:  [id number]

## 2011-02-23 NOTE — Assessment & Plan Note (Signed)
OFFICE VISIT   Cindy Bullock, Cindy Bullock  DOB:  [birth year]                                        January 17, 2008  CHART #:  [id number]   This patient comes in today for 17-month followup.  She is status post a  left upper lobectomy on July 31, 2007.  She continues to make  progress.  She states that she still gets out of breath with significant  exertion, such as mowing her yard, but otherwise is doing very well.   PHYSICAL EXAM:  Vital Signs:  Blood pressure 139/84, pulse is 98,  respirations 18, O2 sat 97% on room air.  Her incisions are all well  healed.  Lungs are clear.  Heart is regular rate and rhythm without  murmurs, rubs or gallops.   Chest x-ray shows no evidence of pneumothorax and is otherwise stable.   ASSESSMENT/PLAN:  This patient is doing well post left upper lobectomy  in October 2008.  She was seen by Dr. Edwyna Shell as well today and we will  plan to see her back in followup in 4 months with the repeat CT scan at  that time.   Cindy Bullock, M.D.  Electronically Signed   GC/MEDQ  D:  01/17/2008  T:  01/17/2008  Job:  [id number]

## 2011-02-23 NOTE — Assessment & Plan Note (Signed)
OFFICE VISIT   Cindy Bullock, Cindy Bullock  DOB:  12-Sep-1951                                        August 18, 2007  CHART #:  [id number]   CURRENT PROBLEMS:  1. Status post left upper lobectomy for non-small cell lung carcinoma      in October, 2008 by Dr. Edwyna Shell.  2. Atelectasis and persistent left apical space with retained chest      tube, post discharge.  3. Increasing pain around the chest tube site.   PRESENT ILLNESS:  Cindy Bullock is a 60 year old ex-smoker who had a left  upper lobectomy for stage I non-small cell carcinoma.  She had problems  with postoperative secretions and atelectasis of the left lung and a  persistent air leak.  She was discharged home with a left apical chest  tube for an apical space of approximately 10%.  Home health nursing has  been attending the patient at home; however, she has had increasing pain  around the chest tube and wants the tube checked out in the office  today.   PHYSICAL EXAMINATION:  Temperature 98.9, blood pressure 128/70, pulse  70, respirations 18, saturation on room air 98%.  She has good breath  sounds bilaterally.  The thoracotomy incision is small and well healed.  The chest tube site, dressing is taken down, and there is some purulent  drainage around the site with erythema.  This is cleaned with peroxide  and painted with Betadine.  New dressing is applied.   LABORATORY DATA:  Her chest x-ray today shows somewhat improvement in  the left apical space with a small 10% hydropneumothorax.   IMPRESSION/PLAN:  The patient is running low on pain medication.  She  was given a new prescription for Tylox.  She also was started on oral  Keflex 500 mg b.i.d. for some mild cellulitis  around the chest tube site.  She is reassured that the chest x-ray and  her recovery are progressing and that the tube needs to stay in until  her next office visit next week.   Kerin Perna, M.D.  Electronically Signed   PV/MEDQ   D:  08/18/2007  T:  08/19/2007  Job:  [id number]

## 2011-02-23 NOTE — Op Note (Signed)
[name], Cindy Bullock                ACCOUNT NO.:  [id number]   MEDICAL RECORD NO.:  [id number]          PATIENT TYPE:  INP   LOCATION:  3311                         FACILITY:  [facility]   PHYSICIAN:  [name], M.D. DATE OF BIRTH:  1950/10/14   DATE OF PROCEDURE:  08/04/2007  DATE OF DISCHARGE:                               OPERATIVE REPORT   PREOPERATIVE DIAGNOSIS:  Atelectasis left lung, status post left upper  lobectomy for non-small cell lung cancer.   POSTOPERATIVE DIAGNOSIS:  Atelectasis left lung, status post left upper  lobectomy for non-small cell lung cancer.   OPERATION PERFORMED:  As video bronchoscopy.   After that this 60 year old patient underwent a VATS left upper  lobectomy and had a persistent air leak after about 4 or 5 days and then  developed a large pneumothorax despite two chest tubes with more  atelectasis of the left lower lobe.  We were worried about the retained  secretions, so the patient was brought to the operating room for  bronchoscopy.  After IV sedation and local anesthesia with Cetacaine and  Xylocaine, the video bronchoscope passed through the mouth.  The carina  was in the midline.  The right upper lobe, right middle lobe and right  lower lobe orifices were normal. There was some mild erythema.  The left  mainstem bronchus had some mild erythema.  The bronchial stump appeared  to be healing well.  We then went into the left lower lobe orifices and  there was small to moderate amount of secretions. A lot less than we  thought but there were definitely secretions there which were  irrigated  out copiously and sent for culture.  We used fluoro to see that the lung  did come up some and we decided to put the lung on chest tubes on high  suction.  The patient was returned recovery room in stable condition.      [name], M.D.  Electronically Signed     DPB/MEDQ  D:  08/04/2007  T:  08/06/2007  Job:  [id number]

## 2011-02-23 NOTE — Assessment & Plan Note (Signed)
OFFICE VISIT   Cindy Bullock, Cindy Bullock  DOB:  [birth year]                                        August 24, 2007  CHART #:  [id number]   [name] returned after removal of her chest tube.  Chest x-ray still  shows an anterior space and slight decrease in the fluid.  She has had  no more drainage. Her blood pressure is 128/77, pulse 71, respirations  18, sats are 95%.  I gave her a slip to stay off work until 09/11/07 and  will plan to see her back again in one week with a chest x-ray.   [name], M.D.  Electronically Signed   DPB/MEDQ  D:  08/24/2007  T:  08/25/2007  Job:  [id number]

## 2011-02-23 NOTE — Assessment & Plan Note (Signed)
OFFICE VISIT   Cindy Bullock, Cindy Bullock  DOB:  [birth year]                                        August 14, 2007  CHART #:  [id number]   SUBJECTIVE:  The patient comes in for a followup today.  She is status  post a left VATS left upper lobectomy by Dr. Edwyna Shell on July 31, 2007.  Postoperatively, she had a persistent air leak and ultimately required  discharge home with a mini-express chest tube.  She reports that over  the weekend her chest tube became discontinued and her home health nurse  instructed her to be seen in the emergency department at the [facility].  The chest tube did not come out of the chest cavity; it  merely became disconnected from the mini-express tubing.  When she was  evaluated in the emergency department, they performed a chest x-ray  which showed a persistent pneumothorax.  The emergency physician  apparently spoke with Dr. Tyrone Sage who instructed them to reconnect the  chest tube and have her follow up in the office today.  She had some  shortness of breath initially when the chest tube became disconnected  but since that time has been breathing comfortably.  She is having some  typical postoperative incisional pain but this is well controlled with  her pain medication.  She has had some slightly increased serous  drainage into her mini-express canister since it became disconnected but  no bleeding.   PHYSICAL EXAM:  Blood pressure 130/76.  Pulse 80.  Respirations 18.  O2  sat 98% on room air.  On examination, a Heimlich valve has been placed  between the chest tube and the mini-express canister.  The chest tube  remains in place in the chest cavity and there is minimal serous  drainage in the canister.  She does continue to have a small air leak  when her mini-express is examined.  It appears that one of her chest  tube sutures has become disrupted and this was replaced with a 0 silk  suture under sterile conditions in the  office.  Her lungs are clear and  her heart is regular rate and rhythm.  A chest x-ray performed today at  [facility] showed persistent left pneumothorax/apical space but  this is unchanged from her previous x-ray.   ASSESSMENT AND PLAN:  The patient is status post a left upper lobectomy  with persistent air leak and a pneumothorax/apical space.  She was seen  and evaluated by myself and Dr. Cornelius Bullock in the office today and her chest  tube suture was replaced as described in detail above.  She has a  followup appointment scheduled to see Dr. Edwyna Shell on November 13th and  she is asked to keep this appointment as previously scheduled.  A home  health nurse is following her and helping to manage the chest tube at  home.  She is instructed to call in the interim if she experiences any  problems or has further questions.   Cindy Bullock, M.D.  Electronically Signed   GC/MEDQ  D:  08/14/2007  T:  08/15/2007  Job:  [id number]

## 2011-02-23 NOTE — Assessment & Plan Note (Signed)
OFFICE VISIT   Cindy Bullock, Cindy Bullock  DOB:  07-30-1951                                        August 23, 2007  CHART #:  [id number]   Cindy Bullock came for follow-up today and her chest x-ray is stable.  There is no leak, we removed her chest tube.  There was some drainage of  serous fluid throughout the track and we gave her dressings for this.  Her blood pressure is 148/83, pulse 63, respirations 18, sats were 98%.  Plan to see her back again tomorrow with another chest x-ray.   [name], M.D.  Electronically Signed   DPB/MEDQ  D:  08/23/2007  T:  08/24/2007  Job:  [id number]

## 2011-06-29 ENCOUNTER — Other Ambulatory Visit: Payer: Self-pay | Admitting: Thoracic Surgery

## 2011-06-29 DIAGNOSIS — D381 Neoplasm of uncertain behavior of trachea, bronchus and lung: Secondary | ICD-10-CM

## 2011-07-12 ENCOUNTER — Other Ambulatory Visit: Payer: Self-pay | Admitting: Thoracic Surgery

## 2011-07-12 DIAGNOSIS — Z1231 Encounter for screening mammogram for malignant neoplasm of breast: Secondary | ICD-10-CM

## 2011-07-21 LAB — BASIC METABOLIC PANEL
BUN: 4 — ABNORMAL LOW
BUN: 4 — ABNORMAL LOW
CO2: 30
Calcium: 8 — ABNORMAL LOW
Chloride: 103
Chloride: 104
Creatinine, Ser: 0.61
GFR calc Af Amer: >60
GFR calc non Af Amer: >60
GFR calc non Af Amer: >60
Glucose, Bld: 133 — ABNORMAL HIGH
Potassium: 3.5
Potassium: 4.2
Sodium: 137

## 2011-07-21 LAB — URINALYSIS, MICROSCOPIC ONLY
Bilirubin Urine: NEGATIVE
Glucose, UA: NEGATIVE
Hgb urine dipstick: NEGATIVE
Leukocytes, UA: NEGATIVE
Protein, ur: NEGATIVE
Specific Gravity, Urine: 1.004 — ABNORMAL LOW

## 2011-07-21 LAB — CBC
HCT: 35.6 — ABNORMAL LOW
HCT: 37.1
HCT: 38.7
Hemoglobin: 12.1
Hemoglobin: 12.4
Hemoglobin: 13.1
MCHC: 33.9
MCV: 92
MCV: 92.1
MCV: 92.2
Platelets: 169
RBC: 3.94
RBC: 4.05
RBC: 4.2
RDW: 13.1
WBC: 5.4
WBC: 9
WBC: 9.1

## 2011-07-21 LAB — COMPREHENSIVE METABOLIC PANEL WITH GFR
ALT: 15
AST: 16
Albumin: 3.8
Alkaline Phosphatase: 61
BUN: 5 — ABNORMAL LOW
CO2: 29
Calcium: 9.1
Chloride: 107
Creatinine, Ser: 0.7
GFR calc non Af Amer: >60
Glucose, Bld: 111 — ABNORMAL HIGH
Potassium: 3.7
Sodium: 139
Total Bilirubin: 0.5
Total Protein: 6.3

## 2011-07-21 LAB — PROTIME-INR
INR: 0.9
Prothrombin Time: 12.4

## 2011-07-21 LAB — MAGNESIUM: Magnesium: 2

## 2011-07-21 LAB — BLOOD GAS, ARTERIAL
Bicarbonate: 24.1 — ABNORMAL HIGH
FIO2: 0.21
TCO2: 25.4
pCO2 arterial: 41.2
pH, Arterial: 7.385
pO2, Arterial: 90

## 2011-07-21 LAB — POCT I-STAT 3, ART BLOOD GAS (G3+)
Acid-base deficit: 2
Bicarbonate: 27.5 — ABNORMAL HIGH
Operator id: 209041
TCO2: 29
pCO2 arterial: 49.8 — ABNORMAL HIGH
pCO2 arterial: 60.9
pH, Arterial: 7.261 — ABNORMAL LOW
pH, Arterial: 7.303 — ABNORMAL LOW
pO2, Arterial: 73 — ABNORMAL LOW
pO2, Arterial: 92

## 2011-07-21 LAB — APTT: aPTT: 26

## 2011-07-21 LAB — COMPREHENSIVE METABOLIC PANEL
ALT: 11
AST: 16
CO2: 28
Chloride: 104
Creatinine, Ser: 0.66
GFR calc Af Amer: >60
GFR calc non Af Amer: >60
Glucose, Bld: 133 — ABNORMAL HIGH
Total Bilirubin: 0.7

## 2011-07-21 LAB — TYPE AND SCREEN: Antibody Screen: NEGATIVE

## 2011-07-21 LAB — AFB CULTURE WITH SMEAR (NOT AT ARMC): Acid Fast Smear: NONE SEEN

## 2011-07-21 LAB — FUNGUS CULTURE W SMEAR

## 2011-08-03 ENCOUNTER — Encounter: Payer: Self-pay | Admitting: Thoracic Surgery

## 2011-08-03 DIAGNOSIS — J449 Chronic obstructive pulmonary disease, unspecified: Secondary | ICD-10-CM | POA: Insufficient documentation

## 2011-08-03 DIAGNOSIS — R918 Other nonspecific abnormal finding of lung field: Secondary | ICD-10-CM

## 2011-08-03 DIAGNOSIS — Z720 Tobacco use: Secondary | ICD-10-CM

## 2011-08-10 ENCOUNTER — Ambulatory Visit
Admission: RE | Admit: 2011-08-10 | Discharge: 2011-08-10 | Disposition: A | Discharge status: Home / Self Care | Payer: Self-pay | Source: Ambulatory Visit | Attending: Thoracic Surgery | Admitting: Thoracic Surgery

## 2011-08-10 ENCOUNTER — Ambulatory Visit: Payer: Self-pay | Admitting: Thoracic Surgery

## 2011-08-10 ENCOUNTER — Other Ambulatory Visit: Payer: Self-pay

## 2011-08-10 DIAGNOSIS — Z1231 Encounter for screening mammogram for malignant neoplasm of breast: Secondary | ICD-10-CM

## 2011-08-18 ENCOUNTER — Encounter: Payer: Self-pay | Admitting: Thoracic Surgery

## 2011-08-18 ENCOUNTER — Ambulatory Visit
Admission: RE | Admit: 2011-08-18 | Discharge: 2011-08-18 | Disposition: A | Discharge status: Home / Self Care | Payer: No Typology Code available for payment source | Source: Ambulatory Visit | Attending: Thoracic Surgery | Admitting: Thoracic Surgery

## 2011-08-18 ENCOUNTER — Ambulatory Visit (INDEPENDENT_AMBULATORY_CARE_PROVIDER_SITE_OTHER): Payer: Self-pay | Admitting: Thoracic Surgery

## 2011-08-18 VITALS — BP 156/82 | HR 82 | Resp 18 | Ht 63.0 in | Wt 132.0 lb

## 2011-08-18 DIAGNOSIS — C3490 Malignant neoplasm of unspecified part of unspecified bronchus or lung: Secondary | ICD-10-CM

## 2011-08-18 DIAGNOSIS — D381 Neoplasm of uncertain behavior of trachea, bronchus and lung: Secondary | ICD-10-CM

## 2011-08-18 NOTE — Progress Notes (Signed)
HPI patient returns about 4-1/2 years since her surgery. CT scan of the chest shows no evidence of recurrence of her cancer. We'll refer her back to her medical Dr. for long-term followup. She is doing well overall.   Current Outpatient Prescriptions  Medication Sig Dispense Refill  . estradiol (ESTRACE) 1 MG tablet Take 1 mg by mouth daily.        . medroxyPROGESTERone (PROVERA) 2.5 MG tablet Take 2.5 mg by mouth daily.           Review of Systems: No change   Physical Exam  Cardiovascular: Normal rate, regular rhythm, normal heart sounds and intact distal pulses.   Pulmonary/Chest: Effort normal and breath sounds normal. No respiratory distress. She has no wheezes.     Diagnostic Tests: CT scan of the chest shows no evidence of recurrence of her cancer   Impression: Status post left upper lobectomy for stage Ia a non-small cell lung cancer adenocarcinoma   Plan: Return as needed

## 2011-09-08 ENCOUNTER — Encounter: Payer: Self-pay | Admitting: *Deleted

## 2012-07-06 ENCOUNTER — Other Ambulatory Visit: Payer: Self-pay | Admitting: Thoracic Surgery

## 2012-07-06 ENCOUNTER — Other Ambulatory Visit: Payer: Self-pay | Admitting: Specialist

## 2012-07-06 DIAGNOSIS — Z1231 Encounter for screening mammogram for malignant neoplasm of breast: Secondary | ICD-10-CM

## 2012-07-21 ENCOUNTER — Ambulatory Visit
Admission: RE | Admit: 2012-07-21 | Discharge: 2012-07-21 | Disposition: A | Discharge status: Home / Self Care | Payer: Self-pay | Source: Ambulatory Visit | Attending: Specialist | Admitting: Specialist

## 2012-07-21 DIAGNOSIS — Z1231 Encounter for screening mammogram for malignant neoplasm of breast: Secondary | ICD-10-CM

## 2012-07-24 ENCOUNTER — Other Ambulatory Visit: Payer: Self-pay | Admitting: Specialist

## 2012-07-24 DIAGNOSIS — R928 Other abnormal and inconclusive findings on diagnostic imaging of breast: Secondary | ICD-10-CM

## 2012-07-27 ENCOUNTER — Ambulatory Visit
Admission: RE | Admit: 2012-07-27 | Discharge: 2012-07-27 | Disposition: A | Discharge status: Home / Self Care | Payer: Self-pay | Source: Ambulatory Visit | Attending: Specialist | Admitting: Specialist

## 2012-07-27 DIAGNOSIS — R928 Other abnormal and inconclusive findings on diagnostic imaging of breast: Secondary | ICD-10-CM

## 2018-12-28 ENCOUNTER — Encounter (INDEPENDENT_AMBULATORY_CARE_PROVIDER_SITE_OTHER): Payer: Self-pay | Admitting: Orthopaedic Surgery

## 2018-12-28 ENCOUNTER — Ambulatory Visit (INDEPENDENT_AMBULATORY_CARE_PROVIDER_SITE_OTHER): Payer: Medicare Other | Admitting: Orthopaedic Surgery

## 2018-12-28 ENCOUNTER — Other Ambulatory Visit: Payer: Self-pay

## 2018-12-28 ENCOUNTER — Ambulatory Visit (INDEPENDENT_AMBULATORY_CARE_PROVIDER_SITE_OTHER): Payer: Medicare Other

## 2018-12-28 VITALS — Ht 63.0 in | Wt 134.0 lb

## 2018-12-28 DIAGNOSIS — M1612 Unilateral primary osteoarthritis, left hip: Secondary | ICD-10-CM | POA: Diagnosis not present

## 2018-12-28 DIAGNOSIS — G8929 Other chronic pain: Secondary | ICD-10-CM

## 2018-12-28 DIAGNOSIS — M25552 Pain in left hip: Secondary | ICD-10-CM | POA: Diagnosis not present

## 2018-12-28 DIAGNOSIS — M25562 Pain in left knee: Secondary | ICD-10-CM | POA: Diagnosis not present

## 2018-12-28 MED ORDER — MELOXICAM 15 MG PO TABS: 15.0000 mg | ORAL_TABLET | Freq: Every day | ORAL | 2 refills | Status: DC

## 2018-12-28 NOTE — Progress Notes (Signed)
Office Visit Note   Patient: Cindy Bullock           Date of Birth: 1951-03-10           MRN: [id number] Visit Date: 12/28/2018              Requested by: No referring provider defined for this encounter. PCP: Dairl Ponder, MD   Assessment & Plan: Visit Diagnoses:  1. Chronic pain of left knee   2. Pain in left hip   3. Unilateral primary osteoarthritis, left hip     Plan: I discussed with patient that her primary problem is hip arthritis and its not her knee.  She can use a cane which she has at home in the opposite right hand.  Will place her on some meloxicam.  Recheck 2 months.  We reviewed the x-rays with large greater than a centimeter marginal osteophytes on the femoral head and joint space narrowing she understands the she may require total hip arthroplasty due to her progressive symptoms.  Hopefully should get some relief with a cane and use of the meloxicam recheck 2 months.  Follow-Up Instructions: Return in about 2 months (around 02/27/2019).   Orders:  Orders Placed This Encounter  Procedures  . XR KNEE 3 VIEW LEFT  . XR Pelvis 1-2 Views   Meds ordered this encounter  Medications  . meloxicam (MOBIC) 15 MG tablet    Sig: Take 1 tablet (15 mg total) by mouth daily.    Dispense:  30 tablet    Refill:  2      Procedures: No procedures performed   Clinical Data: No additional findings.   Subjective: Chief Complaint  Patient presents with  . Left Knee - Pain    HPI 68 year old female has been limping for greater than 6 months having trouble sleeping.  Some of the pain radiates medially down the thigh to her knee and she is concerned she is injured her knee and is questioning knee x-rays.  She states her pain is been chronic and actually started hurting greater than a year ago.  When she limps more husband told her she needs to get it checked out.  Review of Systems patient's former smoker quit 2008.  Past history of lung mass with lobectomy left  lung 2008 some COPD.  She does not drink.  Positive for hypertension previous pneumonia thyroid condition.  She takes thyroid supplement.  Previous tubal ligation 1994 otherwise negative as it pertains HPI.   Objective: Vital Signs: Ht 5\' 3"  (1.6 m)   Wt 134 lb (60.8 kg)   BMI 23.74 kg/m   Physical Exam Constitutional:      Appearance: She is well-developed.  HENT:     Head: Normocephalic.     Right Ear: External ear normal.     Left Ear: External ear normal.  Eyes:     Pupils: Pupils are equal, round, and reactive to light.  Neck:     Thyroid: No thyromegaly.     Trachea: No tracheal deviation.  Cardiovascular:     Rate and Rhythm: Normal rate.  Pulmonary:     Effort: Pulmonary effort is normal.  Abdominal:     Palpations: Abdomen is soft.  Skin:    General: Skin is warm and dry.  Neurological:     Mental Status: She is alert and oriented to person, place, and time.  Psychiatric:        Behavior: Behavior normal.     Ortho Exam  patient has difficulty reaching her ankle on the left no problems with the right.  Not able to figure 4 in a sitting position on the left.  Internal rotation 5 to 10 degrees with sharp groin pain that shoots medially down the thigh to her knee reproducing her pain that she has with ambulation.  Positive Trendelenburg gait.  Opposite right hip has 30 degrees internal/external rotation without pain.  No hip flexion contracture on the left.  Distal pulses are intact no knee effusion knee range of motion collateral ligaments are normal no crepitus.  Specialty Comments:  No specialty comments available.  Imaging: Xr Knee 3 View Left  Result Date: 12/28/2018 AP lateral left knee x-rays obtained and reviewed with sunrise patella.  This shows negative acute changes.  No significant osteoarthritis bony anatomy is normal. Impression: Normal left knee x-rays.  Xr Pelvis 1-2 Views  Result Date: 12/29/2018 Standing AP pelvis with x-rays of both hips  obtained and frog-leg lateral left hip.  This shows left hip joint space narrowing with bone-on-bone changes in the weightbearing portion marginal osteophytes medially and laterally with some subchondral sclerosis no subchondral cyst formation.  Minimal changes in the right hip. Impression: Moderate left hip osteoarthritic changes.    PMFS History: Patient Active Problem List   Diagnosis Date Noted  . COPD (chronic obstructive pulmonary disease) ([place])   . Tobacco abuse   . Lung mass    Past Medical History:  Diagnosis Date  . COPD (chronic obstructive pulmonary disease) ([place])   . Hypertension   . Lung mass    lul  . Tobacco abuse     No family history on file.  Past Surgical History:  Procedure Laterality Date  . As video bronchoscopy.  08/04/2007  . Left VATS, left upper lobectomy.  07/31/2007  . TUBAL LIGATION  1994   Social History   Occupational History  . Not on file  Tobacco Use  . Smoking status: Former Smoker    Types: Cigarettes    Last attempt to quit: 10/11/2008    Years since quitting: 10.2  . Smokeless tobacco: Never Used  Substance and Sexual Activity  . Alcohol use: Yes    Comment: not on a regular basis  . Drug use: No  . Sexual activity: Not on file

## 2018-12-29 ENCOUNTER — Encounter (INDEPENDENT_AMBULATORY_CARE_PROVIDER_SITE_OTHER): Payer: Self-pay | Admitting: Orthopaedic Surgery

## 2018-12-29 DIAGNOSIS — M1612 Unilateral primary osteoarthritis, left hip: Secondary | ICD-10-CM | POA: Insufficient documentation

## 2019-02-22 ENCOUNTER — Ambulatory Visit (INDEPENDENT_AMBULATORY_CARE_PROVIDER_SITE_OTHER): Payer: Medicare Other | Admitting: Orthopaedic Surgery

## 2019-02-22 ENCOUNTER — Other Ambulatory Visit: Payer: Self-pay

## 2019-02-22 ENCOUNTER — Encounter: Payer: Self-pay | Admitting: Orthopaedic Surgery

## 2019-02-22 VITALS — Ht 63.0 in | Wt 135.0 lb

## 2019-02-22 DIAGNOSIS — M1612 Unilateral primary osteoarthritis, left hip: Secondary | ICD-10-CM | POA: Diagnosis not present

## 2019-02-22 NOTE — Progress Notes (Signed)
Office Visit Note   Patient: Cindy Bullock           Date of Birth: 29-Dec-1950           MRN: [id number] Visit Date: 02/22/2019              Requested by: Dairl Ponder, MD [place], [place] PCP: Dairl Ponder, MD   Assessment & Plan: Visit Diagnoses:  1. Unilateral primary osteoarthritis, left hip     Plan: She can continue meloxicam.  If her PCP can refill this she can continue taking this and see me in 1 year.  I discussed with her that normally yearly kidney test and blood count would be checked with his medication.  She is gotten good relief with anti-inflammatories.  We reviewed the x-rays again today with patient also her husband.  Eventually she may require total hip arthroplasty we discussed direct anterior approach procedure and quicker rehab with this approach.  I will recheck her in 1 year.  Follow-Up Instructions: Return in about 1 year (around 02/22/2020).   Orders:  No orders of the defined types were placed in this encounter.  No orders of the defined types were placed in this encounter.     Procedures: No procedures performed   Clinical Data: No additional findings.   Subjective: Chief Complaint  Patient presents with  . Left Hip - Pain, Follow-up  . Left Knee - Pain, Follow-up    HPI 68 year old female returns for unilateral hip osteoarthritis on the left.  She is continue to take the meloxicam for last 2 months and states she is doing much better and is having less pain.  Occasionally when she has been active she may limp slightly most days she is not limping.  She is sleeping well.  She not able to figure 4 cross her leg on the left but she is adapted to this.  Her hip does not prevent her from normal community ambulation.  Review of Systems 14 point update unchanged from last office visit.   Objective: Vital Signs: Ht 5\' 3"  (1.6 m)   Wt 135 lb (61.2 kg)   BMI 23.91 kg/m   Physical Exam Constitutional:    Appearance: She is well-developed.  HENT:     Head: Normocephalic.     Right Ear: External ear normal.     Left Ear: External ear normal.  Eyes:     Pupils: Pupils are equal, round, and reactive to light.  Neck:     Thyroid: No thyromegaly.     Trachea: No tracheal deviation.  Cardiovascular:     Rate and Rhythm: Normal rate.  Pulmonary:     Effort: Pulmonary effort is normal.  Abdominal:     Palpations: Abdomen is soft.  Skin:    General: Skin is warm and dry.  Neurological:     Mental Status: She is alert and oriented to person, place, and time.  Psychiatric:        Behavior: Behavior normal.     Ortho Exam patient has 0 degrees internal rotation left hip which reproduces her groin pain that radiates down to the distal thigh.  Knee range of motion is full.  External rotation left hip is only 25 to 30 degrees.  Opposite right hip internal and external rotates 30 degrees without pain no hip flexion contracture distal pulses are intact no rash over exposed skin negative Trendelenburg gait. Specialty Comments:  No specialty comments available.  Imaging: No results  found.   PMFS History: Patient Active Problem List   Diagnosis Date Noted  . Unilateral primary osteoarthritis, left hip 12/29/2018  . COPD (chronic obstructive pulmonary disease) ([place])   . Tobacco abuse   . Lung mass    Past Medical History:  Diagnosis Date  . COPD (chronic obstructive pulmonary disease) ([place])   . Hypertension   . Lung mass    lul  . Tobacco abuse     No family history on file.  Past Surgical History:  Procedure Laterality Date  . As video bronchoscopy.  08/04/2007  . Left VATS, left upper lobectomy.  07/31/2007  . TUBAL LIGATION  1994   Social History   Occupational History  . Not on file  Tobacco Use  . Smoking status: Former Smoker    Types: Cigarettes    Last attempt to quit: 10/11/2008    Years since quitting: 10.3  . Smokeless tobacco: Never Used  Substance and Sexual  Activity  . Alcohol use: Yes    Comment: not on a regular basis  . Drug use: No  . Sexual activity: Not on file

## 2019-04-21 ENCOUNTER — Other Ambulatory Visit (INDEPENDENT_AMBULATORY_CARE_PROVIDER_SITE_OTHER): Payer: Self-pay | Admitting: Orthopaedic Surgery

## 2019-04-23 NOTE — Telephone Encounter (Signed)
Ok to rf? 

## 2019-07-16 ENCOUNTER — Other Ambulatory Visit (INDEPENDENT_AMBULATORY_CARE_PROVIDER_SITE_OTHER): Payer: Self-pay | Admitting: Orthopaedic Surgery

## 2019-07-16 NOTE — Telephone Encounter (Signed)
Can you advise for Dr. Lorin Mercy since he is out of the office?

## 2019-12-27 ENCOUNTER — Ambulatory Visit (INDEPENDENT_AMBULATORY_CARE_PROVIDER_SITE_OTHER): Payer: Medicare Other | Admitting: Orthopaedic Surgery

## 2019-12-27 ENCOUNTER — Encounter: Payer: Self-pay | Admitting: Orthopaedic Surgery

## 2019-12-27 ENCOUNTER — Ambulatory Visit (INDEPENDENT_AMBULATORY_CARE_PROVIDER_SITE_OTHER): Payer: Medicare Other

## 2019-12-27 ENCOUNTER — Other Ambulatory Visit: Payer: Self-pay

## 2019-12-27 VITALS — BP 161/94 | HR 76 | Ht 63.0 in | Wt 135.0 lb

## 2019-12-27 DIAGNOSIS — M25552 Pain in left hip: Secondary | ICD-10-CM

## 2019-12-27 DIAGNOSIS — M1612 Unilateral primary osteoarthritis, left hip: Secondary | ICD-10-CM

## 2019-12-27 NOTE — Progress Notes (Signed)
Office Visit Note   Patient: Cindy Bullock           Date of Birth: [birth year]           MRN: [id number] Visit Date: 12/27/2019              Requested by: Dairl Ponder, MD [place],  [place] PCP: Dairl Ponder, MD   Assessment & Plan: Visit Diagnoses:  1. Pain in left hip   2. Unilateral primary osteoarthritis, left hip   3.     Hx of small cell CA left lung resected 2008  Plan: Plan left total hip arthroplasty with spinal anesthesia direct anterior approach likely overnight stay in the hospital 23-hour evaluation.  She did have left lung lobectomy with postoperative problems with the pneumothorax and would need medical clearance preoperatively.  She has no dyspnea no shortness of breath with activities.  Follow-Up Instructions: No follow-ups on file.   Orders:  Orders Placed This Encounter  Procedures  . XR HIP UNILAT W OR W/O PELVIS 2-3 VIEWS LEFT   No orders of the defined types were placed in this encounter.     Procedures: No procedures performed   Clinical Data: No additional findings.   Subjective: Chief Complaint  Patient presents with  . Left Hip - Pain  . Left Knee - Pain    HPI 69 year old female returns with gradual progressive left groin pain with progressive limp.  She has bone-on-bone changes in her left hip which has progressed since 2020.  She has been on meloxicam for more than a year use activity modification and states she has returned now to schedule left total hip arthroplasty.  Patient is a former smoker diagnosed with lung cancer 2008 and quit smoking.  She had a lobectomy with good resection no chemo no radiation and clean follow-ups through 2013 when she was released by Dr. Baldemar Friday.  She gets annual chest x-rays which have been unchanged.  Pain bothers her at night if she is more active and walking.  She has problems putting her socks on and has trouble dressing.  At times the pain prohibits her from participating  in certain activities.  Patient states her husband told her it is time to get her hip replacement.  Review of Systems possible left lung cancer with resection 2008 no recurrence.  History of severe COPD former smoker quit 2008.  No dyspnea.  Chronic anti-inflammatories for progressive left hip osteoarthritis.  History of hypothyroidism on Synthroid replacement.  Lipitor for high cholesterol.   Objective: Vital Signs: BP (!) 161/94   Pulse 76   Ht 5\' 3"  (1.6 m)   Wt 135 lb (61.2 kg)   BMI 23.91 kg/m   Physical Exam Constitutional:      Appearance: She is well-developed.  HENT:     Head: Normocephalic.     Right Ear: External ear normal.     Left Ear: External ear normal.  Eyes:     Pupils: Pupils are equal, round, and reactive to light.  Neck:     Thyroid: No thyromegaly.     Trachea: No tracheal deviation.  Cardiovascular:     Rate and Rhythm: Normal rate.  Pulmonary:     Effort: Pulmonary effort is normal.  Abdominal:     Palpations: Abdomen is soft.  Skin:    General: Skin is warm and dry.  Neurological:     Mental Status: She is alert and oriented to person, place, and  time.  Psychiatric:        Behavior: Behavior normal.     Ortho Exam patient has only a 10 degrees internal rotation left hip reproduction of pain.  She cannot figure 4 to put a sock on.  External rotation of her hip 30 degrees.  Opposite hip has 30 internal and external rotation without pain.  No left hip flexion contracture.  Pulses are normal.  No knee effusion.  No trochanteric bursitis no sciatic notch tenderness.  Specialty Comments:  No specialty comments available.  Imaging: XR HIP UNILAT W OR W/O PELVIS 2-3 VIEWS LEFT  Result Date: 12/27/2019 Supine AP and frog-leg left hip demonstrates some marginal osteophytes bone-on-bone changes in the hip joint on the left.  She still has joint space on the right without significant osteophytes. Impression: Moderate to severe left hip osteoarthritis.     PMFS History: Patient Active Problem List   Diagnosis Date Noted  . Unilateral primary osteoarthritis, left hip 12/29/2018  . COPD (chronic obstructive pulmonary disease) ([place])   . Lung mass    Past Medical History:  Diagnosis Date  . COPD (chronic obstructive pulmonary disease) ([place])   . Hypertension   . Lung mass    lul  . Tobacco abuse     No family history on file.  Past Surgical History:  Procedure Laterality Date  . As video bronchoscopy.  08/04/2007  . Left VATS, left upper lobectomy.  07/31/2007  . TUBAL LIGATION  1994   Social History   Occupational History  . Not on file  Tobacco Use  . Smoking status: Former Smoker    Types: Cigarettes    Quit date: 10/11/2008    Years since quitting: 11.2  . Smokeless tobacco: Never Used  Substance and Sexual Activity  . Alcohol use: Yes    Comment: not on a regular basis  . Drug use: No  . Sexual activity: Not on file

## 2020-01-15 NOTE — Progress Notes (Deleted)
[id number] [address], [place] [address] Phone: [phone] Fax: [phone]    Your procedure is scheduled on Monday, April 12th.  Report to [facility] Main Entrance "A" at 10:30 A.M., and check in at the Admitting office.  Call this number if you have problems the morning of surgery:  [phone]  Call [phone] if you have any questions prior to your surgery date Monday-Friday 8am-4pm   Remember:  Do not eat after midnight the night before your surgery  You may drink clear liquids until 9:30 A.M. the morning of your surgery.   Clear liquids allowed are: Water, Non-Citrus Juices (without pulp), Carbonated Beverages, Clear Tea, Black Coffee Only, and Gatorade   Enhanced Recovery after Surgery for Orthopedics Enhanced Recovery after Surgery is a protocol used to improve the stress on your body and your recovery after surgery.  Patient Instructions  . The night before surgery:  o No food after midnight. ONLY clear liquids after midnight  .  Marland Kitchen The day of surgery (if you do NOT have diabetes):  o Drink ONE (1) Pre-Surgery Clear Ensure by 9:30 A.M. the morning of surgery   o This drink was given to you during your hospital  pre-op appointment visit. o Nothing else to drink after completing the  Pre-Surgery Clear Ensure.         If you have questions, please contact your surgeon's office.   Take these medicines the morning of surgery with A SIP OF WATER  atorvastatin (LIPITOR) levothyroxine (SYNTHROID, LEVOTHROID)   If needed -  As of today, STOP taking any Aspirin (unless otherwise instructed by your surgeon) and Aspirin containing products, meloxicam (MOBIC), Aleve, Naproxen, Ibuprofen, Motrin, Advil, Goody's, BC's, all herbal medications, fish oil, and all vitamins.                     Do not wear jewelry, make up, or nail polish            Do not wear lotions, powders, perfumes or deodorant.            Do not shave  48 hours prior to surgery.              Do not bring valuables to the hospital.            [facility] is not responsible for any belongings or valuables.  Do NOT Smoke (Tobacco/Vapping) or drink Alcohol 24 hours prior to your procedure If you use a CPAP at night, you may bring all equipment for your overnight stay.   Contacts, glasses, dentures or bridgework may not be worn into surgery.      For patients admitted to the hospital, discharge time will be determined by your treatment team.   Patients discharged the day of surgery will not be allowed to drive home, and someone needs to stay with them for 24 hours.  Special instructions:   [facility]- Preparing For Surgery  Before surgery, you can play an important role. Because skin is not sterile, your skin needs to be as free of germs as possible. You can reduce the number of germs on your skin by washing with CHG (chlorahexidine gluconate) Soap before surgery.  CHG is an antiseptic cleaner which kills germs and bonds with the skin to continue killing germs even after washing.    Oral Hygiene is also important to reduce your risk of infection.  Remember - BRUSH YOUR  TEETH THE MORNING OF SURGERY WITH YOUR REGULAR TOOTHPASTE  Please do not use if you have an allergy to CHG or antibacterial soaps. If your skin becomes reddened/irritated stop using the CHG.  Do not shave (including legs and underarms) for at least 48 hours prior to first CHG shower. It is OK to shave your face.  Please follow these instructions carefully.   1. Shower the NIGHT BEFORE SURGERY and the MORNING OF SURGERY with CHG Soap.   2. If you chose to wash your hair, wash your hair first as usual with your normal shampoo.  3. After you shampoo, rinse your hair and body thoroughly to remove the shampoo.  4. Use CHG as you would any other liquid soap. You can apply CHG directly to the skin and wash gently with a scrungie or a clean washcloth.   5. Apply the CHG Soap to  your body ONLY FROM THE NECK DOWN.  Do not use on open wounds or open sores. Avoid contact with your eyes, ears, mouth and genitals (private parts). Wash Face and genitals (private parts)  with your normal soap.   6. Wash thoroughly, paying special attention to the area where your surgery will be performed.  7. Thoroughly rinse your body with warm water from the neck down.  8. DO NOT shower/wash with your normal soap after using and rinsing off the CHG Soap.  9. Pat yourself dry with a CLEAN TOWEL.  10. Wear CLEAN PAJAMAS to bed the night before surgery, wear comfortable clothes the morning of surgery  11. Place CLEAN SHEETS on your bed the night of your first shower and DO NOT SLEEP WITH PETS.  Day of Surgery: Do not apply any deodorants/lotions.  Please wear clean clothes to the hospital/surgery center.   Remember to brush your teeth WITH YOUR REGULAR TOOTHPASTE.   Please read over the following fact sheets that you were given.

## 2020-01-15 NOTE — Progress Notes (Signed)
[id number] [address], [place] [address] Phone: [phone] Fax: [phone]    Your procedure is scheduled on Monday, April 12th.  Report to [facility] Main Entrance "A" at 10:30 A.M., and check in at the Admitting office.  Call this number if you have problems the morning of surgery:  [phone]  Call [phone] if you have any questions prior to your surgery date Monday-Friday 8am-4pm   Remember:  Do not eat after midnight the night before your surgery  You may drink clear liquids until 9:30 A.M. the morning of your surgery.   Clear liquids allowed are: Water, Non-Citrus Juices (without pulp), Carbonated Beverages, Clear Tea, Black Coffee Only, and Gatorade   Enhanced Recovery after Surgery for Orthopedics Enhanced Recovery after Surgery is a protocol used to improve the stress on your body and your recovery after surgery.  Patient Instructions  . The night before surgery:  o No food after midnight. ONLY clear liquids after midnight  .  Marland Kitchen The day of surgery (if you do NOT have diabetes):  o Drink ONE (1) Pre-Surgery Clear Ensure by 9:30 A.M. the morning of surgery   o This drink was given to you during your hospital  pre-op appointment visit. o Nothing else to drink after completing the  Pre-Surgery Clear Ensure.         If you have questions, please contact your surgeon's office.   Take these medicines the morning of surgery with A SIP OF WATER  atorvastatin (LIPITOR) levothyroxine (SYNTHROID, LEVOTHROID)   As of today, STOP taking any Aspirin (unless otherwise instructed by your surgeon) and Aspirin containing products, meloxicam (MOBIC), Aleve, Naproxen, Ibuprofen, Motrin, Advil, Goody's, BC's, all herbal medications, fish oil, and all vitamins.                     Do not wear jewelry, make up, or nail polish            Do not wear lotions, powders, perfumes or deodorant.            Do not shave 48 hours prior  to surgery.              Do not bring valuables to the hospital.            [facility] is not responsible for any belongings or valuables.  Do NOT Smoke (Tobacco/Vapping) or drink Alcohol 24 hours prior to your procedure If you use a CPAP at night, you may bring all equipment for your overnight stay.   Contacts, glasses, dentures or bridgework may not be worn into surgery.      For patients admitted to the hospital, discharge time will be determined by your treatment team.   Patients discharged the day of surgery will not be allowed to drive home, and someone needs to stay with them for 24 hours.  Special instructions:   [facility]- Preparing For Surgery  Before surgery, you can play an important role. Because skin is not sterile, your skin needs to be as free of germs as possible. You can reduce the number of germs on your skin by washing with CHG (chlorahexidine gluconate) Soap before surgery.  CHG is an antiseptic cleaner which kills germs and bonds with the skin to continue killing germs even after washing.    Oral Hygiene is also important to reduce your risk of infection.  Remember - BRUSH YOUR TEETH THE MORNING OF  SURGERY WITH YOUR REGULAR TOOTHPASTE  Please do not use if you have an allergy to CHG or antibacterial soaps. If your skin becomes reddened/irritated stop using the CHG.  Do not shave (including legs and underarms) for at least 48 hours prior to first CHG shower. It is OK to shave your face.  Please follow these instructions carefully.   1. Shower the NIGHT BEFORE SURGERY and the MORNING OF SURGERY with CHG Soap.   2. If you chose to wash your hair, wash your hair first as usual with your normal shampoo.  3. After you shampoo, rinse your hair and body thoroughly to remove the shampoo.  4. Use CHG as you would any other liquid soap. You can apply CHG directly to the skin and wash gently with a scrungie or a clean washcloth.   5. Apply the CHG Soap to your body ONLY  FROM THE NECK DOWN.  Do not use on open wounds or open sores. Avoid contact with your eyes, ears, mouth and genitals (private parts). Wash Face and genitals (private parts)  with your normal soap.   6. Wash thoroughly, paying special attention to the area where your surgery will be performed.  7. Thoroughly rinse your body with warm water from the neck down.  8. DO NOT shower/wash with your normal soap after using and rinsing off the CHG Soap.  9. Pat yourself dry with a CLEAN TOWEL.  10. Wear CLEAN PAJAMAS to bed the night before surgery, wear comfortable clothes the morning of surgery  11. Place CLEAN SHEETS on your bed the night of your first shower and DO NOT SLEEP WITH PETS.  Day of Surgery: Do not apply any deodorants/lotions.  Please wear clean clothes to the hospital/surgery center.   Remember to brush your teeth WITH YOUR REGULAR TOOTHPASTE.   Please read over the following fact sheets that you were given.

## 2020-01-16 ENCOUNTER — Other Ambulatory Visit: Payer: Self-pay

## 2020-01-16 ENCOUNTER — Ambulatory Visit (INDEPENDENT_AMBULATORY_CARE_PROVIDER_SITE_OTHER): Payer: Medicare Other | Admitting: Surgery

## 2020-01-16 ENCOUNTER — Encounter (HOSPITAL_COMMUNITY): Payer: Self-pay

## 2020-01-16 ENCOUNTER — Encounter: Payer: Self-pay | Admitting: Surgery

## 2020-01-16 ENCOUNTER — Encounter (HOSPITAL_COMMUNITY)
Admission: RE | Admit: 2020-01-16 | Discharge: 2020-01-16 | Disposition: A | Discharge status: Home / Self Care | Payer: Medicare Other | Source: Ambulatory Visit | Attending: Surgery | Admitting: Surgery

## 2020-01-16 ENCOUNTER — Encounter (HOSPITAL_COMMUNITY)
Admission: RE | Admit: 2020-01-16 | Discharge: 2020-01-16 | Disposition: A | Discharge status: Home / Self Care | Payer: Medicare Other | Source: Ambulatory Visit | Attending: Orthopaedic Surgery | Admitting: Orthopaedic Surgery

## 2020-01-16 ENCOUNTER — Telehealth: Payer: Self-pay

## 2020-01-16 VITALS — BP 184/81 | HR 73 | Temp 97.6°F

## 2020-01-16 DIAGNOSIS — M1612 Unilateral primary osteoarthritis, left hip: Secondary | ICD-10-CM

## 2020-01-16 DIAGNOSIS — Z01818 Encounter for other preprocedural examination: Secondary | ICD-10-CM | POA: Insufficient documentation

## 2020-01-16 DIAGNOSIS — R9431 Abnormal electrocardiogram [ECG] [EKG]: Secondary | ICD-10-CM | POA: Insufficient documentation

## 2020-01-16 HISTORY — DX: Umbilical hernia without obstruction or gangrene: K42.9

## 2020-01-16 HISTORY — DX: Unilateral primary osteoarthritis, left hip: M16.12

## 2020-01-16 HISTORY — DX: Shortness of breath: R06.02

## 2020-01-16 LAB — COMPREHENSIVE METABOLIC PANEL
ALT: 20 U/L (ref 0–44)
AST: 29 U/L (ref 15–41)
Albumin: 4.4 g/dL (ref 3.5–5.0)
Alkaline Phosphatase: 99 U/L (ref 38–126)
Anion gap: 9 (ref 5–15)
BUN: 11 mg/dL (ref 8–23)
CO2: 26 mmol/L (ref 22–32)
Calcium: 9.5 mg/dL (ref 8.9–10.3)
Chloride: 105 mmol/L (ref 98–111)
Creatinine, Ser: 0.78 mg/dL (ref 0.44–1.00)
GFR calc Af Amer: >60 mL/min (ref >60)
GFR calc non Af Amer: >60 mL/min (ref >60)
Glucose, Bld: 124 mg/dL — ABNORMAL HIGH (ref 70–99)
Potassium: 4.5 mmol/L (ref 3.5–5.1)
Sodium: 140 mmol/L (ref 135–145)
Total Bilirubin: 0.4 mg/dL (ref 0.3–1.2)
Total Protein: 7.9 g/dL (ref 6.5–8.1)

## 2020-01-16 LAB — CBC
HCT: 45.1 % (ref 36.0–46.0)
Hemoglobin: 14.2 g/dL (ref 12.0–15.0)
MCH: 29.5 pg (ref 26.0–34.0)
MCHC: 31.5 g/dL (ref 30.0–36.0)
MCV: 93.8 fL (ref 80.0–100.0)
Platelets: 175 10*3/uL (ref 150–400)
RBC: 4.81 MIL/uL (ref 3.87–5.11)
RDW: 12.4 % (ref 11.5–15.5)
WBC: 5.2 10*3/uL (ref 4.0–10.5)
nRBC: 0 % (ref 0.0–0.2)

## 2020-01-16 LAB — URINALYSIS, ROUTINE W REFLEX MICROSCOPIC
Bilirubin Urine: NEGATIVE
Glucose, UA: NEGATIVE mg/dL
Hgb urine dipstick: NEGATIVE
Ketones, ur: NEGATIVE mg/dL
Leukocytes,Ua: NEGATIVE
Nitrite: NEGATIVE
Protein, ur: NEGATIVE mg/dL
Specific Gravity, Urine: 1.015 (ref 1.005–1.030)
pH: 5 (ref 5.0–8.0)

## 2020-01-16 LAB — SURGICAL PCR SCREEN
MRSA, PCR: NEGATIVE
Staphylococcus aureus: NEGATIVE

## 2020-01-16 NOTE — Progress Notes (Signed)
69 year old white female history of end-stage DJD left hip comes in for preop evaluation.  States that symptoms unchanged from previous visit.  She is wanting to proceed with left total hip replacement as scheduled.  Today history and physical performed.  We received preop medical clearance.   discussed along with potential hospital stay.  All questions answered.

## 2020-01-16 NOTE — Progress Notes (Signed)
PCP - Dr. Audery Amel Vasireddy Cardiologist - denies  PPM/ICD - denies  Chest x-ray - 01/16/2020 EKG - 01/16/2020 Stress Test - denies ECHO - denies Cardiac Cath - denies  Sleep Study - denies  DM: denies  Blood Thinner Instructions: N/A Aspirin Instructions: N/A  ERAS Protcol - Yes PRE-SURGERY Ensure or G2- Ensure given   COVID TEST- Scheduled for 01/17/2020. Patient verbalized understanding of self-quarantine instructions, appointment time and place.  Anesthesia review: YES, Abnormal EKG, records requested from PCP's office.   Per Mack Guise PA, EKG and Chest Xray to be done today at PAT appointment.  Patient denies shortness of breath, fever, cough and chest pain at PAT appointment  All instructions explained to the patient, with a verbal understanding of the material. Patient agrees to go over the instructions while at home for a better understanding. Patient also instructed to self quarantine after being tested for COVID-19. The opportunity to ask questions was provided.

## 2020-01-16 NOTE — Telephone Encounter (Signed)
Nurse from pre admissons called and would like to know if patient needs to repeat  EKG and Chest X-ray? States this was done back in January. Per Benjiman Core PA, yes repeat studies.

## 2020-01-17 ENCOUNTER — Other Ambulatory Visit (HOSPITAL_COMMUNITY)
Admission: RE | Admit: 2020-01-17 | Discharge: 2020-01-17 | Disposition: A | Discharge status: Home / Self Care | Payer: Medicare Other | Source: Ambulatory Visit | Attending: Orthopaedic Surgery | Admitting: Orthopaedic Surgery

## 2020-01-17 DIAGNOSIS — Z01812 Encounter for preprocedural laboratory examination: Secondary | ICD-10-CM | POA: Diagnosis present

## 2020-01-17 DIAGNOSIS — Z20822 Contact with and (suspected) exposure to covid-19: Secondary | ICD-10-CM | POA: Insufficient documentation

## 2020-01-17 NOTE — Anesthesia Preprocedure Evaluation (Addendum)
Anesthesia Evaluation  Patient identified by MRN, date of birth, ID band Patient awake    Reviewed: Allergy & Precautions, NPO status , Patient's Chart, lab work & pertinent test results  History of Anesthesia Complications Negative for: history of anesthetic complications  Airway Mallampati: II  TM Distance: >3 FB     Dental  (+) Dental Advisory Given, Teeth Intact   Pulmonary COPD, neg recent URI, former smoker,    breath sounds clear to auscultation       Cardiovascular hypertension, Pt. on medications  Rhythm:Regular     Neuro/Psych negative neurological ROS  negative psych ROS   GI/Hepatic negative GI ROS, Neg liver ROS,   Endo/Other  Hypothyroidism   Renal/GU      Musculoskeletal  (+) Arthritis ,   Abdominal   Peds  Hematology negative hematology ROS (+)   Anesthesia Other Findings   Reproductive/Obstetrics                            Anesthesia Physical Anesthesia Plan  ASA: II  Anesthesia Plan: MAC and Spinal   Post-op Pain Management:    Induction:   PONV Risk Score and Plan: Treatment may vary due to age or medical condition and Propofol infusion  Airway Management Planned: Nasal Cannula  Additional Equipment: None  Intra-op Plan:   Post-operative Plan:   Informed Consent: I have reviewed the patients History and Physical, chart, labs and discussed the procedure including the risks, benefits and alternatives for the proposed anesthesia with the patient or authorized representative who has indicated his/her understanding and acceptance.       Plan Discussed with: CRNA  Anesthesia Plan Comments: (Lung CA s/p left upper lobectomy 2008. This is followed by PCP Dr. Lunette Stands. Preop clearance on chart dated 01/02/20. )       Anesthesia Quick Evaluation

## 2020-01-18 LAB — SARS CORONAVIRUS 2 (TAT 6-24 HRS): SARS Coronavirus 2: NEGATIVE

## 2020-01-18 MED ORDER — BUPIVACAINE LIPOSOME 1.3 % IJ SUSP
10.0000 mL | INTRAMUSCULAR | Status: AC
  Administered 2020-01-21: 20 mL
  Filled 2020-01-18: qty 10

## 2020-01-21 ENCOUNTER — Observation Stay (HOSPITAL_COMMUNITY): Payer: Medicare Other

## 2020-01-21 ENCOUNTER — Encounter (HOSPITAL_COMMUNITY): Payer: Self-pay | Admitting: Orthopaedic Surgery

## 2020-01-21 ENCOUNTER — Encounter (HOSPITAL_COMMUNITY)
Admission: RE | Disposition: A | Discharge status: Home Health | Payer: Self-pay | Source: Home / Self Care | Attending: Orthopaedic Surgery

## 2020-01-21 ENCOUNTER — Ambulatory Visit (HOSPITAL_COMMUNITY): Payer: Medicare Other | Admitting: Certified Registered Nurse Anesthetist

## 2020-01-21 ENCOUNTER — Other Ambulatory Visit: Payer: Self-pay

## 2020-01-21 ENCOUNTER — Ambulatory Visit (HOSPITAL_COMMUNITY): Payer: Medicare Other | Admitting: Physician Assistant

## 2020-01-21 ENCOUNTER — Inpatient Hospital Stay (HOSPITAL_COMMUNITY)
Admission: RE | Admit: 2020-01-21 | Discharge: 2020-01-23 | DRG: 470 | Disposition: A | Discharge status: Home Health | Payer: Medicare Other | Attending: Orthopaedic Surgery | Admitting: Orthopaedic Surgery

## 2020-01-21 ENCOUNTER — Ambulatory Visit (HOSPITAL_COMMUNITY): Payer: Medicare Other

## 2020-01-21 DIAGNOSIS — Y92234 Operating room of hospital as the place of occurrence of the external cause: Secondary | ICD-10-CM | POA: Diagnosis not present

## 2020-01-21 DIAGNOSIS — Z419 Encounter for procedure for purposes other than remedying health state, unspecified: Secondary | ICD-10-CM

## 2020-01-21 DIAGNOSIS — D62 Acute posthemorrhagic anemia: Secondary | ICD-10-CM | POA: Diagnosis not present

## 2020-01-21 DIAGNOSIS — N9972 Accidental puncture and laceration of a genitourinary system organ or structure during other procedure: Secondary | ICD-10-CM | POA: Diagnosis not present

## 2020-01-21 DIAGNOSIS — Z79899 Other long term (current) drug therapy: Secondary | ICD-10-CM

## 2020-01-21 DIAGNOSIS — M1612 Unilateral primary osteoarthritis, left hip: Principal | ICD-10-CM | POA: Diagnosis present

## 2020-01-21 DIAGNOSIS — I10 Essential (primary) hypertension: Secondary | ICD-10-CM | POA: Diagnosis present

## 2020-01-21 DIAGNOSIS — J449 Chronic obstructive pulmonary disease, unspecified: Secondary | ICD-10-CM | POA: Diagnosis present

## 2020-01-21 DIAGNOSIS — Z79890 Hormone replacement therapy (postmenopausal): Secondary | ICD-10-CM

## 2020-01-21 DIAGNOSIS — E039 Hypothyroidism, unspecified: Secondary | ICD-10-CM | POA: Diagnosis present

## 2020-01-21 DIAGNOSIS — Y798 Miscellaneous orthopedic devices associated with adverse incidents, not elsewhere classified: Secondary | ICD-10-CM | POA: Diagnosis not present

## 2020-01-21 DIAGNOSIS — Z09 Encounter for follow-up examination after completed treatment for conditions other than malignant neoplasm: Secondary | ICD-10-CM

## 2020-01-21 DIAGNOSIS — Z87891 Personal history of nicotine dependence: Secondary | ICD-10-CM

## 2020-01-21 DIAGNOSIS — R310 Gross hematuria: Secondary | ICD-10-CM

## 2020-01-21 DIAGNOSIS — I959 Hypotension, unspecified: Secondary | ICD-10-CM | POA: Diagnosis not present

## 2020-01-21 HISTORY — PX: TOTAL HIP ARTHROPLASTY: SHX124

## 2020-01-21 LAB — PREPARE RBC (CROSSMATCH)

## 2020-01-21 SURGERY — ARTHROPLASTY, HIP, TOTAL, ANTERIOR APPROACH
Anesthesia: Monitor Anesthesia Care | Site: Hip | Laterality: Left

## 2020-01-21 MED ORDER — PHENYLEPHRINE HCL-NACL 10-0.9 MG/250ML-% IV SOLN
INTRAVENOUS | Status: DC | PRN
  Administered 2020-01-21: 25 ug/min via INTRAVENOUS

## 2020-01-21 MED ORDER — FENTANYL CITRATE (PF) 100 MCG/2ML IJ SOLN
INTRAMUSCULAR | Status: AC
  Filled 2020-01-21: qty 2

## 2020-01-21 MED ORDER — FENTANYL CITRATE (PF) 100 MCG/2ML IJ SOLN
25.0000 ug | INTRAMUSCULAR | Status: DC | PRN
  Administered 2020-01-21 (×3): 25 ug via INTRAVENOUS

## 2020-01-21 MED ORDER — FENTANYL CITRATE (PF) 250 MCG/5ML IJ SOLN
INTRAMUSCULAR | Status: AC
  Filled 2020-01-21: qty 5

## 2020-01-21 MED ORDER — BUPIVACAINE HCL (PF) 0.25 % IJ SOLN
INTRAMUSCULAR | Status: AC
  Filled 2020-01-21: qty 30

## 2020-01-21 MED ORDER — CHLORHEXIDINE GLUCONATE CLOTH 2 % EX PADS
6.0000 | MEDICATED_PAD | Freq: Every day | CUTANEOUS | Status: DC
  Administered 2020-01-22 – 2020-01-23 (×2): 6 via TOPICAL

## 2020-01-21 MED ORDER — SODIUM CHLORIDE 0.9% IV SOLUTION: Freq: Once | INTRAVENOUS | Status: DC

## 2020-01-21 MED ORDER — METOCLOPRAMIDE HCL 5 MG/ML IJ SOLN: 5.0000 mg | Freq: Three times a day (TID) | INTRAMUSCULAR | Status: DC | PRN

## 2020-01-21 MED ORDER — IOTHALAMATE MEGLUMINE 17.2 % UR SOLN
250.0000 mL | Freq: Once | URETHRAL | Status: AC | PRN
  Administered 2020-01-21: 50 mL

## 2020-01-21 MED ORDER — METHOCARBAMOL 1000 MG/10ML IJ SOLN
500.0000 mg | Freq: Four times a day (QID) | INTRAVENOUS | Status: DC | PRN
  Filled 2020-01-21: qty 5

## 2020-01-21 MED ORDER — PROPOFOL 500 MG/50ML IV EMUL
INTRAVENOUS | Status: DC | PRN
  Administered 2020-01-21: 75 ug/kg/min via INTRAVENOUS

## 2020-01-21 MED ORDER — ACETAMINOPHEN 325 MG PO TABS
325.0000 mg | ORAL_TABLET | Freq: Four times a day (QID) | ORAL | Status: DC | PRN
  Administered 2020-01-22: 650 mg via ORAL
  Filled 2020-01-21: qty 2

## 2020-01-21 MED ORDER — OXYCODONE HCL 5 MG/5ML PO SOLN: 5.0000 mg | Freq: Once | ORAL | Status: DC | PRN

## 2020-01-21 MED ORDER — ACETAMINOPHEN 10 MG/ML IV SOLN
INTRAVENOUS | Status: AC
  Filled 2020-01-21: qty 100

## 2020-01-21 MED ORDER — ALBUMIN HUMAN 5 % IV SOLN: INTRAVENOUS | Status: DC | PRN

## 2020-01-21 MED ORDER — LIDOCAINE HCL (CARDIAC) PF 100 MG/5ML IV SOSY
PREFILLED_SYRINGE | INTRAVENOUS | Status: DC | PRN
  Administered 2020-01-21: 40 mg via INTRAVENOUS

## 2020-01-21 MED ORDER — ONDANSETRON HCL 4 MG/2ML IJ SOLN: 4.0000 mg | Freq: Four times a day (QID) | INTRAMUSCULAR | Status: DC | PRN

## 2020-01-21 MED ORDER — FENTANYL CITRATE (PF) 250 MCG/5ML IJ SOLN
INTRAMUSCULAR | Status: DC | PRN
  Administered 2020-01-21: 50 ug via INTRAVENOUS

## 2020-01-21 MED ORDER — ACETAMINOPHEN 10 MG/ML IV SOLN
1000.0000 mg | Freq: Once | INTRAVENOUS | Status: DC | PRN
  Administered 2020-01-21: 1000 mg via INTRAVENOUS

## 2020-01-21 MED ORDER — DOCUSATE SODIUM 100 MG PO CAPS
100.0000 mg | ORAL_CAPSULE | Freq: Two times a day (BID) | ORAL | Status: DC
  Administered 2020-01-21 – 2020-01-23 (×4): 100 mg via ORAL
  Filled 2020-01-21 (×4): qty 1

## 2020-01-21 MED ORDER — BUPIVACAINE IN DEXTROSE 0.75-8.25 % IT SOLN
INTRATHECAL | Status: DC | PRN
  Administered 2020-01-21: 1.6 mL via INTRATHECAL

## 2020-01-21 MED ORDER — 0.9 % SODIUM CHLORIDE (POUR BTL) OPTIME
TOPICAL | Status: DC | PRN
  Administered 2020-01-21 (×2): 1000 mL

## 2020-01-21 MED ORDER — POLYETHYLENE GLYCOL 3350 17 G PO PACK: 17.0000 g | PACK | Freq: Every day | ORAL | Status: DC | PRN

## 2020-01-21 MED ORDER — PROPOFOL 1000 MG/100ML IV EMUL
INTRAVENOUS | Status: AC
  Filled 2020-01-21: qty 300

## 2020-01-21 MED ORDER — METOCLOPRAMIDE HCL 5 MG PO TABS: 5.0000 mg | ORAL_TABLET | Freq: Three times a day (TID) | ORAL | Status: DC | PRN

## 2020-01-21 MED ORDER — PROPOFOL 10 MG/ML IV BOLUS
INTRAVENOUS | Status: DC | PRN
  Administered 2020-01-21 (×2): 20 mg via INTRAVENOUS

## 2020-01-21 MED ORDER — HYDROMORPHONE HCL 1 MG/ML IJ SOLN
0.5000 mg | INTRAMUSCULAR | Status: DC | PRN
  Administered 2020-01-22 (×2): 0.5 mg via INTRAVENOUS
  Filled 2020-01-21 (×2): qty 1

## 2020-01-21 MED ORDER — PHENOL 1.4 % MT LIQD: 1.0000 | OROMUCOSAL | Status: DC | PRN

## 2020-01-21 MED ORDER — ONDANSETRON HCL 4 MG/2ML IJ SOLN
4.0000 mg | Freq: Once | INTRAMUSCULAR | Status: AC
  Administered 2020-01-21: 4 mg via INTRAVENOUS

## 2020-01-21 MED ORDER — PHENYLEPHRINE 40 MCG/ML (10ML) SYRINGE FOR IV PUSH (FOR BLOOD PRESSURE SUPPORT)
PREFILLED_SYRINGE | INTRAVENOUS | Status: DC | PRN
  Administered 2020-01-21 (×3): 80 ug via INTRAVENOUS
  Administered 2020-01-21 (×4): 40 ug via INTRAVENOUS

## 2020-01-21 MED ORDER — ONDANSETRON HCL 4 MG/2ML IJ SOLN
INTRAMUSCULAR | Status: AC
  Filled 2020-01-21: qty 2

## 2020-01-21 MED ORDER — SODIUM CHLORIDE 0.9 % IV SOLN: INTRAVENOUS | Status: DC

## 2020-01-21 MED ORDER — LACTATED RINGERS IV SOLN: INTRAVENOUS | Status: DC

## 2020-01-21 MED ORDER — ACETAMINOPHEN 160 MG/5ML PO SOLN: 1000.0000 mg | Freq: Once | ORAL | Status: DC | PRN

## 2020-01-21 MED ORDER — TRANEXAMIC ACID-NACL 1000-0.7 MG/100ML-% IV SOLN
INTRAVENOUS | Status: DC | PRN
  Administered 2020-01-21: 1000 mg via INTRAVENOUS

## 2020-01-21 MED ORDER — BUPIVACAINE-EPINEPHRINE 0.25% -1:200000 IJ SOLN
INTRAMUSCULAR | Status: DC | PRN
  Administered 2020-01-21: 20 mL

## 2020-01-21 MED ORDER — MENTHOL 3 MG MT LOZG: 1.0000 | LOZENGE | OROMUCOSAL | Status: DC | PRN

## 2020-01-21 MED ORDER — ASPIRIN EC 325 MG PO TBEC
325.0000 mg | DELAYED_RELEASE_TABLET | Freq: Every day | ORAL | Status: DC
  Administered 2020-01-22 – 2020-01-23 (×2): 325 mg via ORAL
  Filled 2020-01-21 (×2): qty 1

## 2020-01-21 MED ORDER — CEFAZOLIN SODIUM-DEXTROSE 2-4 GM/100ML-% IV SOLN
2.0000 g | INTRAVENOUS | Status: AC
  Administered 2020-01-21: 2 g via INTRAVENOUS
  Filled 2020-01-21: qty 100

## 2020-01-21 MED ORDER — CEFAZOLIN SODIUM-DEXTROSE 1-4 GM/50ML-% IV SOLN: 1.0000 g | Freq: Three times a day (TID) | INTRAVENOUS | Status: DC

## 2020-01-21 MED ORDER — FESOTERODINE FUMARATE ER 8 MG PO TB24
8.0000 mg | ORAL_TABLET | Freq: Every day | ORAL | Status: DC
  Administered 2020-01-21 – 2020-01-23 (×3): 8 mg via ORAL
  Filled 2020-01-21 (×3): qty 1

## 2020-01-21 MED ORDER — OXYCODONE HCL 5 MG PO TABS
5.0000 mg | ORAL_TABLET | ORAL | Status: DC | PRN
  Administered 2020-01-21: 5 mg via ORAL
  Administered 2020-01-22 – 2020-01-23 (×5): 10 mg via ORAL
  Filled 2020-01-21: qty 1
  Filled 2020-01-21 (×5): qty 2

## 2020-01-21 MED ORDER — METHOCARBAMOL 500 MG PO TABS
500.0000 mg | ORAL_TABLET | Freq: Four times a day (QID) | ORAL | Status: DC | PRN
  Administered 2020-01-21: 23:00:00 500 mg via ORAL
  Filled 2020-01-21: qty 1

## 2020-01-21 MED ORDER — FENTANYL CITRATE (PF) 100 MCG/2ML IJ SOLN
25.0000 ug | INTRAMUSCULAR | Status: AC | PRN
  Administered 2020-01-21 (×6): 25 ug via INTRAVENOUS

## 2020-01-21 MED ORDER — ONDANSETRON HCL 4 MG PO TABS: 4.0000 mg | ORAL_TABLET | Freq: Four times a day (QID) | ORAL | Status: DC | PRN

## 2020-01-21 MED ORDER — PHENYLEPHRINE HCL-NACL 10-0.9 MG/250ML-% IV SOLN
0.0000 ug/min | INTRAVENOUS | Status: DC
  Filled 2020-01-21: qty 250

## 2020-01-21 MED ORDER — TRANEXAMIC ACID 1000 MG/10ML IV SOLN
2000.0000 mg | Freq: Once | INTRAVENOUS | Status: DC
  Filled 2020-01-21: qty 20

## 2020-01-21 MED ORDER — OXYCODONE HCL 5 MG PO TABS: 5.0000 mg | ORAL_TABLET | Freq: Once | ORAL | Status: DC | PRN

## 2020-01-21 MED ORDER — ACETAMINOPHEN 500 MG PO TABS: 1000.0000 mg | ORAL_TABLET | Freq: Once | ORAL | Status: DC | PRN

## 2020-01-21 SURGICAL SUPPLY — 60 items
ACETAB CUP W GRIPTION 54MM (Plate) ×1 IMPLANT
ACETAB CUP W/GRIPTION 54 (Plate) ×2 IMPLANT
BENZOIN TINCTURE PRP APPL 2/3 (GAUZE/BANDAGES/DRESSINGS) ×3 IMPLANT
BLADE CLIPPER SURG (BLADE) IMPLANT
BLADE SAW SGTL 18X1.27X75 (BLADE) ×2 IMPLANT
BLADE SAW SGTL 18X1.27X75MM (BLADE) ×1
CELLS DAT CNTRL 66122 CELL SVR (MISCELLANEOUS) ×1 IMPLANT
CLOSURE WOUND 1/2 X4 (GAUZE/BANDAGES/DRESSINGS) ×1
COVER SURGICAL LIGHT HANDLE (MISCELLANEOUS) ×3 IMPLANT
COVER WAND RF STERILE (DRAPES) ×3 IMPLANT
CUP ACETAB W/GRIPTION 54 (Plate) IMPLANT
CUP PINN GRIPTON 54 100 (Cup) ×2 IMPLANT
DRAPE C-ARM 42X72 X-RAY (DRAPES) ×3 IMPLANT
DRAPE IMP U-DRAPE 54X76 (DRAPES) ×3 IMPLANT
DRAPE STERI IOBAN 125X83 (DRAPES) ×3 IMPLANT
DRAPE U-SHAPE 47X51 STRL (DRAPES) ×9 IMPLANT
DRSG MEPILEX BORDER 4X8 (GAUZE/BANDAGES/DRESSINGS) ×3 IMPLANT
DURAPREP 26ML APPLICATOR (WOUND CARE) ×3 IMPLANT
ELECT BLADE 4.0 EZ CLEAN MEGAD (MISCELLANEOUS)
ELECT CAUTERY BLADE 6.4 (BLADE) ×3 IMPLANT
ELECT REM PT RETURN 9FT ADLT (ELECTROSURGICAL) ×3
ELECTRODE BLDE 4.0 EZ CLN MEGD (MISCELLANEOUS) IMPLANT
ELECTRODE REM PT RTRN 9FT ADLT (ELECTROSURGICAL) ×1 IMPLANT
ELIMINATOR HOLE APEX DEPUY (Hips) ×4 IMPLANT
FACESHIELD WRAPAROUND (MASK) ×6 IMPLANT
FACESHIELD WRAPAROUND OR TEAM (MASK) ×2 IMPLANT
GLOVE BIOGEL PI IND STRL 8 (GLOVE) ×2 IMPLANT
GLOVE BIOGEL PI INDICATOR 8 (GLOVE) ×4
GLOVE ORTHO TXT STRL SZ7.5 (GLOVE) ×6 IMPLANT
GOWN STRL REUS W/ TWL LRG LVL3 (GOWN DISPOSABLE) ×1 IMPLANT
GOWN STRL REUS W/ TWL XL LVL3 (GOWN DISPOSABLE) ×1 IMPLANT
GOWN STRL REUS W/TWL 2XL LVL3 (GOWN DISPOSABLE) ×3 IMPLANT
GOWN STRL REUS W/TWL LRG LVL3 (GOWN DISPOSABLE) ×2
GOWN STRL REUS W/TWL XL LVL3 (GOWN DISPOSABLE) ×2
HEAD CERAMIC DELTA 36 PLUS 1.5 (Hips) ×2 IMPLANT
KIT BASIN OR (CUSTOM PROCEDURE TRAY) ×3 IMPLANT
KIT TURNOVER KIT B (KITS) ×3 IMPLANT
LINER NEUTRAL 54X36MM PLUS 4 (Hips) ×4 IMPLANT
MANIFOLD NEPTUNE II (INSTRUMENTS) ×3 IMPLANT
NS IRRIG 1000ML POUR BTL (IV SOLUTION) ×3 IMPLANT
PACK TOTAL JOINT (CUSTOM PROCEDURE TRAY) ×3 IMPLANT
PAD ARMBOARD 7.5X6 YLW CONV (MISCELLANEOUS) ×6 IMPLANT
RETRACTOR WND ALEXIS 18 MED (MISCELLANEOUS) ×1 IMPLANT
RTRCTR WOUND ALEXIS 18CM MED (MISCELLANEOUS) ×3
SCREW 6.5MMX25MM (Screw) ×2 IMPLANT
SCREW PINN CAN 6.5X20 (Screw) ×2 IMPLANT
SCREW PINN CAN BONE 6.5MMX15MM (Screw) ×2 IMPLANT
STEM FEMORAL SZ 6MM STD ACTIS (Stem) ×2 IMPLANT
STRIP CLOSURE SKIN 1/2X4 (GAUZE/BANDAGES/DRESSINGS) ×2 IMPLANT
SUT VIC AB 0 CT1 27 (SUTURE) ×2
SUT VIC AB 0 CT1 27XBRD ANBCTR (SUTURE) ×1 IMPLANT
SUT VIC AB 2-0 CT1 27 (SUTURE) ×2
SUT VIC AB 2-0 CT1 TAPERPNT 27 (SUTURE) ×1 IMPLANT
SUT VICRYL 4-0 PS2 18IN ABS (SUTURE) ×3 IMPLANT
SUT VLOC 180 0 24IN GS25 (SUTURE) ×3 IMPLANT
TOWEL GREEN STERILE (TOWEL DISPOSABLE) ×6 IMPLANT
TOWEL GREEN STERILE FF (TOWEL DISPOSABLE) ×3 IMPLANT
TRAY CATH 16FR W/PLASTIC CATH (SET/KITS/TRAYS/PACK) IMPLANT
TRAY FOLEY MTR SLVR 16FR STAT (SET/KITS/TRAYS/PACK) IMPLANT
WATER STERILE IRR 1000ML POUR (IV SOLUTION) ×6 IMPLANT

## 2020-01-21 NOTE — Anesthesia Procedure Notes (Signed)
Spinal  Patient location during procedure: OR Start time: 01/21/2020 12:26 PM End time: 01/21/2020 12:33 PM Staffing Performed: anesthesiologist  Anesthesiologist: Oleta Mouse, MD Preanesthetic Checklist Completed: patient identified, IV checked, risks and benefits discussed, surgical consent, monitors and equipment checked, pre-op evaluation and timeout performed Spinal Block Patient position: sitting Prep: DuraPrep Patient monitoring: heart rate, cardiac monitor, continuous pulse ox and blood pressure Approach: midline Location: L3-4 Injection technique: single-shot Needle Needle type: Pencan  Needle gauge: 24 G Needle length: 9 cm Assessment Sensory level: T6 Additional Notes IV functioning, monitors applied to pt. Expiration date of kit checked and confirmed to be in date. Sterile prep and drape, hand hygiene and sterile gloved used. Pt was positioned and spine was prepped in sterile fashion. Skin was anesthetized with lidocaine. Free flow of clear CSF obtained prior to injecting local anesthetic into CSF x 1 attempt. Spinal needle aspirated freely following injection. Needle was carefully withdrawn, and pt tolerated procedure well. Loss of motor and sensory on exam post injection.

## 2020-01-21 NOTE — H&P (Signed)
TOTAL HIP ADMISSION H&P  Patient is admitted for left total hip arthroplasty.  Subjective:  Chief Complaint: left hip pain  HPI: Cindy Bullock, 70 y.o. female, has a history of pain and functional disability in the left hip(s) due to arthritis and patient has failed non-surgical conservative treatments for greater than 12 weeks to include NSAID's and/or analgesics, use of assistive devices and activity modification.  Onset of symptoms was gradual starting 10 years ago with gradually worsening course since that time.The patient noted no past surgery on the left hip(s).  Patient currently rates pain in the left hip at 10 out of 10 with activity. Patient has night pain, worsening of pain with activity and weight bearing, trendelenberg gait, pain that interfers with activities of daily living and joint swelling. Patient has evidence of subchondral cysts, subchondral sclerosis, periarticular osteophytes and joint space narrowing by imaging studies. This condition presents safety issues increasing the risk of falls.   There is no current active infection.  Patient Active Problem List   Diagnosis Date Noted  . Unilateral primary osteoarthritis, left hip 12/29/2018  . COPD (chronic obstructive pulmonary disease) ([place])   . Lung mass    Past Medical History:  Diagnosis Date  . Arthritis of left hip   . COPD (chronic obstructive pulmonary disease) ([place])   . Hypertension   . Lung mass    lul  . SOB (shortness of breath) on exertion    01/16/2020: per patient short of breath sometimes  . Tobacco abuse   . Umbilical hernia     Past Surgical History:  Procedure Laterality Date  . As video bronchoscopy.  08/04/2007  . Left VATS, left upper lobectomy.  07/31/2007  . TUBAL LIGATION  1994    Current Facility-Administered Medications  Medication Dose Route Frequency Provider Last Rate Last Admin  . bupivacaine liposome (EXPAREL) 1.3 % injection 133 mg  10 mL Infiltration To OR Marybelle Killings, MD        Current Outpatient Medications  Medication Sig Dispense Refill Last Dose  . atorvastatin (LIPITOR) 10 MG tablet Take 10 mg by mouth daily.     Marland Kitchen levothyroxine (SYNTHROID, LEVOTHROID) 50 MCG tablet Take 50 mcg by mouth daily before breakfast.      . lisinopril (ZESTRIL) 20 MG tablet Take 10 mg by mouth daily.      . meloxicam (MOBIC) 15 MG tablet TAKE 1 TABLET BY MOUTH EVERY DAY (Patient not taking: Reported on 01/08/2020) 30 tablet 2 Not Taking at Unknown time   No Known Allergies  Social History   Tobacco Use  . Smoking status: Former Smoker    Types: Cigarettes    Quit date: 10/11/2008    Years since quitting: 11.2  . Smokeless tobacco: Never Used  . Tobacco comment: 01/16/2020: per patient quit back in 2008  Substance Use Topics  . Alcohol use: Yes    Comment: not on a regular basis    No family history on file.   Review of Systems  Constitutional: Negative.   HENT: Negative.   Eyes: Negative.   Respiratory: Negative.   Cardiovascular: Negative.   Gastrointestinal: Negative.   Genitourinary: Negative.   Musculoskeletal: Positive for gait problem.  Psychiatric/Behavioral: Negative.     Objective:  Physical Exam  Constitutional: She is oriented to person, place, and time. She appears well-developed.  HENT:  Head: Normocephalic and atraumatic.  Eyes: Pupils are equal, round, and reactive to light. EOM are normal.  Respiratory: Effort normal. No respiratory  distress.  GI: She exhibits no distension.  Musculoskeletal:        General: Tenderness present.     Cervical back: Normal range of motion.  Neurological: She is alert and oriented to person, place, and time.  Skin: Skin is warm and dry.    Vital signs in last 24 hours:    Labs:   Estimated body mass index is 23.98 kg/m as calculated from the following:   Height as of 01/16/20: 5\' 3"  (1.6 m).   Weight as of 01/16/20: 61.4 kg.   Imaging Review Plain radiographs demonstrate moderate degenerative joint  disease of the left hip(s). The bone quality appears to be good for age and reported activity level.      Assessment/Plan:  End stage arthritis, left hip(s)  The patient history, physical examination, clinical judgement of the provider and imaging studies are consistent with end stage degenerative joint disease of the left hip(s) and total hip arthroplasty is deemed medically necessary. The treatment options including medical management, injection therapy, arthroscopy and arthroplasty were discussed at length. The risks and benefits of total hip arthroplasty were presented and reviewed. The risks due to aseptic loosening, infection, stiffness, dislocation/subluxation,  thromboembolic complications and other imponderables were discussed.  The patient acknowledged the explanation, agreed to proceed with the plan and consent was signed. Patient is being admitted for inpatient treatment for surgery, pain control, PT, OT, prophylactic antibiotics, VTE prophylaxis, progressive ambulation and ADL's and discharge planning.The patient is planning to be discharged home with home health services

## 2020-01-21 NOTE — Progress Notes (Signed)
In and out cath at end of procedure 100cc half bloody urine. Regular foley placed. Concern for possible bladder injury with acetabular screw placement. Discussed with Dr. Claudia Desanctis Urology and she requested retrograde cystogram to see if bladder intact or if there is a bladder perforation and location retro vs intra -peritoneal. Dr. Claudia Desanctis to see after she finished emergent case at [facility]. Discussed with husband plan.

## 2020-01-21 NOTE — Progress Notes (Signed)
Received telephone / verbal order from Dr Rodell Perna to add 4 mg Detrol LA (Tolterodine) daily to patient profile. Unable to find product to  process the order. Called Pharmacy and spoke to [place], [facility].  who provided drug therapeutic substitution to order as to 8 mg Toviaz (Fesoterodine) daily. Carried out. Leveda Anna, BSN, RN.

## 2020-01-21 NOTE — Op Note (Addendum)
Preop diagnosis: Left hip osteoarthritis postop diagnosis: Same  Procedure: Left total hip arthroplasty.  Direct anterior approach  Surgeon: Rodell Perna, MD  Anesthesia: Spinal  Assistant: Benjiman Core, PA-C medically necessary and present for the entire procedure.  Implants: Depuy 54 mm cup Gripton cup with screws, plus 4 neutral liner 54X15mm, apex hole eliminator, 6.5 X25 mm and 6.5 X15 mm screws.  Feuoral stem size 6 Standard Actis stem 36 mm ceramic head plus 1.5   Complications: Foley catheter insertion at end of case documented bloody urine and Dr. Claudia Desanctis urologist on-call contacted and patient was transferred recovery room and then down to radiology for a retrograde cystogram which showed small area of extravasation left side of bladder felt to be extraperitoneal.  Foley catheter x10 days and IV antibiotics Ancef coverage while she is in the hospital and discharged on p.o. Keflex per Dr. Claudia Desanctis.  Bladder perforation from drill used for placement of the 2nd  acetabular screw.   Procedure: After the induction of spinal anesthesia patient was placed in Hana boots placed on the Hana table 1015 drapes were applied.  Anesthesia spinal worked well.  C-arm was brought in and there was good visualization of both hips.  Patient's hip was prepped with DuraPrep the usual large shower curtain Steri-Drape applied after sterile skin marker and usual sheet across the top and opposite side.  Hole was cut in the drape and the hydraulic arm was attached and sealed with the piece of Betadine Steri-Drape.  Timeout procedure was completed TXA was given and Ancef was given problem prophylactically.  Direct anterior approach was made fascia was identified nicked and cobra retractor was placed over the medial aspect of the femur on the outside of the capsule.  Transverse bleeder was coagulated small amount of fat was removed the capsule was opened and the dull cobra was placed inside the capsule.  Continue resection of the  anterior capsule and then using Hibbs bring in the C arm the neck was cut under visualization with about 11 mm neck or 1 fingerbreadth above the lesser trochanter.  The cut was completed superior laterally with the osteotome.  Head was removed with a corkscrew with some difficulty due to a large spur on the femur that was preventing it from rotating to divide the ligamentum.  Once head was removed there was erosive where eburnated bone on both sides of the joint.  Edges of the labrum was resected as well as transverse acetabular ligament.  90 degree retractor was placed medially across the anterior aspect directly down on bone.  Sequential reaming was progressed.  We progressed up to a 53 for 54cup.  Cup did not want completely sit down we took it out touched up with reaming with a 53 then went back to 52 then did the 53 mm and then inserted the cup it seemed to sit down completely we checked it under fluoroscopy for cup flexion and version.  It appeared to be stable liner was inserted.  As we are working on the femur later it appeared that the cup was moved slightly.  We had progressed up to a #6 stem which gave an excellent fit so he stopped on the femoral side went back and remove the cup placed an acetabular liner with 3 dome holes and rotation was selected.  Since the acetabulum had shifted slightly felt to screws were needed.  Central screw was placed up 25 mm directly superior inside bone.  The dura was used had a stop  on it and the anterior medial hole was selected and drilled.  I did not feel that we plunged any.  Initially selected 20 mm screw for its position and checking under x-ray it appeared to be a few millimeters to long we swapped out for a 15 mm screw there was no bleeding from the screw hole screw will set down flat.  Liner was inserted cup was solid stable impacted and we progressed back to the femur.  We had used cookie cutter, curettes rondure for lateralization, chili pepper and then  progressed up to the #6 which gave excellent fill and the stem was center down the canal.  1.5 neck gave trace shock was stable and external rotation 90 degrees in extension to 45 degrees halfway down to the floor.  There was no impingement.  Hip was dislocated and the stem was removed.  The hydraulic arm was used in standard fashion removing each time we change positions.  Permanent stem was impacted and +1.5 ball was selected this restored leg length was again found to be stable.  AP hip frog-leg hip at 90 degrees external rotation was taken followed by a picture of the pelvis seen both lesser trochanters for measurement of leg length.  We checked for bleeding once again the operative field was dry.  The skin protector been used on the skin it was removed.  V lock closure was used for running the fascia 2-0 Vicryl subtendinous tissue skin staple closure postop dressing.  Patient was taken off the Hana table and a Foley catheter was inserted.  It was noted that the urine was a mixture of blood and urine.  I just talked to the patient's husband while Benjiman Core, PA-C was closing subtenons tissue and skin.  Prompt call was placed to the urologist on-call Dr. Claudia Desanctis who recommended diabetes inserted to the Foley for retrograde cystogram to check for leakage.  This was ordered I discussed it with the radiologist as well as radiology tech and once patient was stabilized in the recovery room and had some fluids and had brief neodrip she was transferred down to radiology where retrograde cystogram was performed that showed slight leakage on the left side felt to be retroperitoneal.  Dr. Claudia Desanctis  had recommended leaving the catheter in for this.  1 unit of blood been given prior to patient going down to radiology likely from bleeding from the bladder perforation.  Second unit was being run in anticipation of possible surgery for repair of the bladder if it was not retroperitoneal.  Patient was stable and I discussed with  both patient and patient's husband unplanned bladder injury as well as retrograde cystogram test that was being needed and treatment.  Dr. Claudia Desanctis to see  patient and discussed further recommendations with patient.

## 2020-01-21 NOTE — OR Nursing (Signed)
Disregard timeout at 1223 01/21/20, revised. Loran Senters, RN

## 2020-01-21 NOTE — Progress Notes (Signed)
En route to radiology for pt's cystogram at 1616.

## 2020-01-21 NOTE — Transfer of Care (Signed)
Immediate Anesthesia Transfer of Care Note  Patient: Cindy Bullock  Procedure(s) Performed: LEFT TOTAL HIP ARTHROPLASTY-DIRECT ANTERIOR (Left Hip)  Patient Location: PACU  Anesthesia Type:Spinal  Level of Consciousness: awake, alert  and oriented  Airway & Oxygen Therapy: Patient Spontanous Breathing and Patient connected to nasal cannula oxygen  Post-op Assessment: Report given to RN and Post -op Vital signs reviewed and stable  Post vital signs: Reviewed and stable  Last Vitals:  Vitals Value Taken Time  BP 140/62 01/21/20 1520  Temp    Pulse 78 01/21/20 1524  Resp 18 01/21/20 1524  SpO2 100 % 01/21/20 1524  Vitals shown include unvalidated device data.  Last Pain:  Vitals:   01/21/20 1050  TempSrc: Oral  PainSc:          Complications: No apparent anesthesia complications

## 2020-01-21 NOTE — Progress Notes (Signed)
Back to PACU from radiology at 1710.

## 2020-01-21 NOTE — Consult Note (Signed)
I have been asked to see the patient by Dr. Rodell Perna, for evaluation and management of gross hematuria.  History of present illness: 69 yo woman presented for surgery today for left hip replacement.  At the end of surgery, a foley was inserted and gross blood was seen.  Urology was consulted for further management.  Cystogram was recommended and images appears consistent with extraperitoneal bladder injury.  Patient was seen and examined in PACU.     Review of systems: A 12 point comprehensive review of systems was obtained and is negative unless otherwise stated in the history of present illness.  Patient Active Problem List   Diagnosis Date Noted  . Arthritis of left hip 01/21/2020  . Unilateral primary osteoarthritis, left hip 12/29/2018  . COPD (chronic obstructive pulmonary disease) ([place])   . Lung mass     No current facility-administered medications on file prior to encounter.   Current Outpatient Medications on File Prior to Encounter  Medication Sig Dispense Refill  . atorvastatin (LIPITOR) 10 MG tablet Take 10 mg by mouth daily.    Marland Kitchen levothyroxine (SYNTHROID, LEVOTHROID) 50 MCG tablet Take 50 mcg by mouth daily before breakfast.     . lisinopril (ZESTRIL) 20 MG tablet Take 10 mg by mouth daily.     . meloxicam (MOBIC) 15 MG tablet TAKE 1 TABLET BY MOUTH EVERY DAY (Patient not taking: Reported on 01/08/2020) 30 tablet 2    Past Medical History:  Diagnosis Date  . Arthritis of left hip   . COPD (chronic obstructive pulmonary disease) ([place])   . Hypertension   . Lung mass    lul  . SOB (shortness of breath) on exertion    01/16/2020: per patient short of breath sometimes  . Tobacco abuse   . Umbilical hernia     Past Surgical History:  Procedure Laterality Date  . As video bronchoscopy.  08/04/2007  . Left VATS, left upper lobectomy.  07/31/2007  . TUBAL LIGATION  1994    Social History   Tobacco Use  . Smoking status: Former Smoker    Types: Cigarettes    Quit  date: 10/11/2008    Years since quitting: 11.2  . Smokeless tobacco: Never Used  . Tobacco comment: 01/16/2020: per patient quit back in 2008  Substance Use Topics  . Alcohol use: Yes    Comment: not on a regular basis  . Drug use: No    History reviewed. No pertinent family history.  PE: Vitals:   01/21/20 1900 01/21/20 1915 01/21/20 1954 01/21/20 2021  BP: (!) 126/49 (!) 118/52 116/63 (!) 123/51  Pulse: 78 80 71 70  Resp: 18 13 13 16   Temp:  97.8 F (36.6 C) 97.9 F (36.6 C) 98 F (36.7 C)  TempSrc:   Oral Oral  SpO2: 94% 96% 96% 95%  Weight:      Height:    5\' 2"  (1.575 m)   Patient appears to be in no acute distress  patient is lethargic in PACU but conversant Atraumatic normocephalic head No cervical or supraclavicular lymphadenopathy appreciated No increased work of breathing, no audible wheezes/rhonchi Regular sinus rhythm/rate Abdomen is soft, nontender, nondistended, no CVA or suprapubic tenderness GU: foley draining light red urine Grossly neurologically intact No identifiable skin lesions  No results for input(s): WBC, HGB, HCT in the last 72 hours. No results for input(s): NA, K, CL, CO2, GLUCOSE, BUN, CREATININE, CALCIUM in the last 72 hours. No results for input(s): LABPT, INR in the last  72 hours. No results for input(s): LABURIN in the last 72 hours. Results for orders placed or performed during the hospital encounter of 01/17/20  SARS CORONAVIRUS 2 (TAT 6-24 HRS) Nasopharyngeal Nasopharyngeal Swab     Status: None   Collection Time: 01/17/20  6:51 AM   Specimen: Nasopharyngeal Swab  Result Value Ref Range Status   SARS Coronavirus 2 NEGATIVE NEGATIVE Final    Comment: (NOTE) SARS-CoV-2 target nucleic acids are NOT DETECTED. The SARS-CoV-2 RNA is generally detectable in upper and lower respiratory specimens during the acute phase of infection. Negative results do not preclude SARS-CoV-2 infection, do not rule out co-infections with other pathogens, and  should not be used as the sole basis for treatment or other patient management decisions. Negative results must be combined with clinical observations, patient history, and epidemiological information. The expected result is Negative. Fact Sheet for Patients: SugarRoll.be Fact Sheet for Healthcare Providers: https://www.woods-mathews.com/ This test is not yet approved or cleared by the [place] FDA and  has been authorized for detection and/or diagnosis of SARS-CoV-2 by FDA under an Emergency Use Authorization (EUA). This EUA will remain  in effect (meaning this test can be used) for the duration of the COVID-19 declaration under Section 56 4(b)(1) of the Act, 21 U.S.C. section 360bbb-3(b)(1), unless the authorization is terminated or revoked sooner. Performed at [facility], [place] [address], [place], [place] [address]     Imaging: Fluoro cystogram 01/21/20 IMPRESSION: 1. Left-sided leak from the urinary bladder. This formed a somewhat stellate collection which did not shift significantly with gentle turning of the patient, most likely extraperitoneal.  Imp: 69 yo woman POD0 s/p left hip replacement with extraperitoneal bladder rupture.    Recommendations: -foley draining well and urine appears thin -continue foley for 10 days -recommend detrol LA 4mg  daily for bladder rest to aid in healing/help with bladder spasms -will arrange outpatient cystogram and follow up with Dr. Claudia Desanctis at [facility] in approx 10 for foley removal  Thank you for involving me in this patient's care, I will continue to follow along. Please page with any further questions or concerns. Senai Kingsley D Jazlin Tapscott

## 2020-01-21 NOTE — Progress Notes (Signed)
Pt post op acetab screw, with approx 800 cc EBL and possible continued bleeding. Pt's SAB is receding, yet patient is requiring Phenylephrine BP support. On iSTAT, Hb 8.2.  Discussed with Dr Lorin Mercy, who agrees transfuse 1u PRBC. Pt en route for cystoscopy, will transfuse as soon as PRBc prepared.  Jenita Seashore, MD

## 2020-01-21 NOTE — Interval H&P Note (Signed)
History and Physical Interval Note:  01/21/2020 12:12 PM  Cindy Bullock  has presented today for surgery, with the diagnosis of left hip osteoarthritis.  The various methods of treatment have been discussed with the patient and family. After consideration of risks, benefits and other options for treatment, the patient has consented to  Procedure(s): LEFT TOTAL HIP ARTHROPLASTY-DIRECT ANTERIOR (Left) as a surgical intervention.  The patient's history has been reviewed, patient examined, no change in status, stable for surgery.  I have reviewed the patient's chart and labs.  Questions were answered to the patient's satisfaction.     Marybelle Killings

## 2020-01-22 ENCOUNTER — Encounter: Payer: Self-pay | Admitting: *Deleted

## 2020-01-22 DIAGNOSIS — Z79890 Hormone replacement therapy (postmenopausal): Secondary | ICD-10-CM | POA: Diagnosis not present

## 2020-01-22 DIAGNOSIS — J449 Chronic obstructive pulmonary disease, unspecified: Secondary | ICD-10-CM | POA: Diagnosis present

## 2020-01-22 DIAGNOSIS — E039 Hypothyroidism, unspecified: Secondary | ICD-10-CM | POA: Diagnosis present

## 2020-01-22 DIAGNOSIS — N9972 Accidental puncture and laceration of a genitourinary system organ or structure during other procedure: Secondary | ICD-10-CM | POA: Diagnosis not present

## 2020-01-22 DIAGNOSIS — Y798 Miscellaneous orthopedic devices associated with adverse incidents, not elsewhere classified: Secondary | ICD-10-CM | POA: Diagnosis not present

## 2020-01-22 DIAGNOSIS — Z87891 Personal history of nicotine dependence: Secondary | ICD-10-CM | POA: Diagnosis not present

## 2020-01-22 DIAGNOSIS — I959 Hypotension, unspecified: Secondary | ICD-10-CM | POA: Diagnosis not present

## 2020-01-22 DIAGNOSIS — D62 Acute posthemorrhagic anemia: Secondary | ICD-10-CM | POA: Diagnosis not present

## 2020-01-22 DIAGNOSIS — M1612 Unilateral primary osteoarthritis, left hip: Secondary | ICD-10-CM | POA: Diagnosis present

## 2020-01-22 DIAGNOSIS — Z79899 Other long term (current) drug therapy: Secondary | ICD-10-CM | POA: Diagnosis not present

## 2020-01-22 DIAGNOSIS — I10 Essential (primary) hypertension: Secondary | ICD-10-CM | POA: Diagnosis present

## 2020-01-22 DIAGNOSIS — Y92234 Operating room of hospital as the place of occurrence of the external cause: Secondary | ICD-10-CM | POA: Diagnosis not present

## 2020-01-22 LAB — BASIC METABOLIC PANEL
Anion gap: 5 (ref 5–15)
BUN: 13 mg/dL (ref 8–23)
CO2: 28 mmol/L (ref 22–32)
Calcium: 8.3 mg/dL — ABNORMAL LOW (ref 8.9–10.3)
Chloride: 107 mmol/L (ref 98–111)
Creatinine, Ser: 0.82 mg/dL (ref 0.44–1.00)
GFR calc Af Amer: >60 mL/min (ref >60)
GFR calc non Af Amer: >60 mL/min (ref >60)
Glucose, Bld: 151 mg/dL — ABNORMAL HIGH (ref 70–99)
Potassium: 5 mmol/L (ref 3.5–5.1)
Sodium: 140 mmol/L (ref 135–145)

## 2020-01-22 LAB — CBC
HCT: 35.9 % — ABNORMAL LOW (ref 36.0–46.0)
Hemoglobin: 12 g/dL (ref 12.0–15.0)
MCH: 30.3 pg (ref 26.0–34.0)
MCHC: 33.4 g/dL (ref 30.0–36.0)
MCV: 90.7 fL (ref 80.0–100.0)
Platelets: 97 10*3/uL — ABNORMAL LOW (ref 150–400)
RBC: 3.96 MIL/uL (ref 3.87–5.11)
RDW: 13.9 % (ref 11.5–15.5)
WBC: 7.2 10*3/uL (ref 4.0–10.5)
nRBC: 0 % (ref 0.0–0.2)

## 2020-01-22 NOTE — Evaluation (Signed)
Physical Therapy Evaluation Patient Details Name: Cindy Bullock MRN: [id number] DOB: 1951-03-10 Today's Date: 01/22/2020   History of Present Illness  Pt admit for LEFT TOTAL HIP ARTHROPLASTY-DIRECT ANTERIOR.  Bladder injury post op.    Clinical Impression  Pt admitted with above diagnosis. Pt was able to ambulate a short distance with min assist and cues for technique and somewhat limited by dizziness.  BP soft but stable with pt in chair.  Will continue PT.   Pt currently with functional limitations due to the deficits listed below (see PT Problem List). Pt will benefit from skilled PT to increase their independence and safety with mobility to allow discharge to the venue listed below.      Follow Up Recommendations Home health PT;Supervision/Assistance - 24 hour    Equipment Recommendations  Rolling walker with 5" wheels;3in1 (PT)(may purchase tub bench )    Recommendations for Other Services       Precautions / Restrictions Precautions Precautions: Fall Restrictions Weight Bearing Restrictions: Yes LLE Weight Bearing: Weight bearing as tolerated      Mobility  Bed Mobility Overal bed mobility: Needs Assistance Bed Mobility: Supine to Sit     Supine to sit: Mod assist;+2 for physical assistance     General bed mobility comments: Pt was able to come to EOB with assist for left LE and assist for trunk elevation. Once to EOB, scooted to EOB on her own with incr time.   Transfers Overall transfer level: Needs assistance Equipment used: Rolling walker (2 wheeled) Transfers: Sit to/from Stand Sit to Stand: Mod assist;From elevated surface         General transfer comment: Mod assist to power up and cues for technique.   Ambulation/Gait Ambulation/Gait assistance: Min assist Gait Distance (Feet): 5 Feet Assistive device: Rolling walker (2 wheeled) Gait Pattern/deviations: Step-to pattern;Decreased step length - left;Decreased stance time - left;Decreased weight shift  to left;Decreased dorsiflexion - left;Antalgic   Gait velocity interpretation: <1.31 ft/sec, indicative of household ambulator General Gait Details: Pt was able to ambulate with min assist with cues for technique.  Initially assisted pt to move her left LE as she counldnt advance it without help but she got to where she could.  Pt c/o dizziness after 5 feet therefore asked her to back  up and pulled the chair to her.  BP is soft.    Stairs            Wheelchair Mobility    Modified Rankin (Stroke Patients Only)       Balance Overall balance assessment: Needs assistance Sitting-balance support: No upper extremity supported;Feet supported Sitting balance-Leahy Scale: Fair     Standing balance support: Bilateral upper extremity supported;During functional activity Standing balance-Leahy Scale: Poor Standing balance comment: relies on UE support.                              Pertinent Vitals/Pain Pain Assessment: Faces Faces Pain Scale: Hurts a little bit Pain Location: left hip Pain Descriptors / Indicators: Sore Pain Intervention(s): Limited activity within patient's tolerance;Premedicated before session;Monitored during session;Repositioned    Home Living Family/patient expects to be discharged to:: Private residence Living Arrangements: Spouse/significant other Available Help at Discharge: Family;Available 24 hours/day Type of Home: House Home Access: Stairs to enter Entrance Stairs-Rails: None Entrance Stairs-Number of Steps: 1 Home Layout: One level Home Equipment: None      Prior Function Level of Independence: Independent  Hand Dominance   Dominant Hand: Right    Extremity/Trunk Assessment   Upper Extremity Assessment Upper Extremity Assessment: Defer to OT evaluation    Lower Extremity Assessment Lower Extremity Assessment: Generalized weakness    Cervical / Trunk Assessment Cervical / Trunk Assessment: Normal   Communication   Communication: No difficulties  Cognition Arousal/Alertness: Awake/alert Behavior During Therapy: WFL for tasks assessed/performed Overall Cognitive Status: Within Functional Limits for tasks assessed                                        General Comments General comments (skin integrity, edema, etc.): 112-135 bpm, 96% on 2L, 88-92% on RA with activity.  124/63 initially and 104/76 at end of treatment    Exercises General Exercises - Lower Extremity Ankle Circles/Pumps: AROM;Both;5 reps;Supine Quad Sets: AROM;Both;5 reps;Supine   Assessment/Plan    PT Assessment Patient needs continued PT services  PT Problem List Decreased activity tolerance;Decreased balance;Decreased mobility;Decreased knowledge of use of DME;Decreased safety awareness;Decreased knowledge of precautions;Pain;Decreased range of motion;Decreased strength       PT Treatment Interventions DME instruction;Gait training;Therapeutic activities;Stair training;Functional mobility training;Therapeutic exercise;Balance training;Patient/family education    PT Goals (Current goals can be found in the Care Plan section)  Acute Rehab PT Goals Patient Stated Goal: to go home PT Goal Formulation: With patient Time For Goal Achievement: 02/05/20 Potential to Achieve Goals: Good    Frequency 7X/week   Barriers to discharge        Co-evaluation               AM-PAC PT "6 Clicks" Mobility  Outcome Measure Help needed turning from your back to your side while in a flat bed without using bedrails?: A Lot Help needed moving from lying on your back to sitting on the side of a flat bed without using bedrails?: A Lot Help needed moving to and from a bed to a chair (including a wheelchair)?: A Lot Help needed standing up from a chair using your arms (e.g., wheelchair or bedside chair)?: A Lot Help needed to walk in hospital room?: A Lot Help needed climbing 3-5 steps with a railing? :  Total 6 Click Score: 11    End of Session Equipment Utilized During Treatment: Gait belt;Oxygen Activity Tolerance: Patient limited by fatigue Patient left: in chair;with call bell/phone within reach;with chair alarm set Nurse Communication: Mobility status PT Visit Diagnosis: Muscle weakness (generalized) (M62.81);Unsteadiness on feet (R26.81);Pain Pain - Right/Left: Left Pain - part of body: Hip    Time: 0920-0954 PT Time Calculation (min) (ACUTE ONLY): 34 min   Charges:   PT Evaluation $PT Eval Moderate Complexity: 1 Mod PT Treatments $Gait Training: 8-22 mins        Aslynn Brunetti W,PT Acute Rehabilitation Services Pager:  [phone]  Office:  [place] 01/22/2020, 12:49 PM

## 2020-01-22 NOTE — Anesthesia Postprocedure Evaluation (Signed)
Anesthesia Post Note  Patient: Cindy Bullock  Procedure(s) Performed: LEFT TOTAL HIP ARTHROPLASTY-DIRECT ANTERIOR (Left Hip)     Patient location during evaluation: PACU Anesthesia Type: MAC and Spinal Level of consciousness: awake and alert Pain management: pain level controlled Vital Signs Assessment: post-procedure vital signs reviewed and stable Respiratory status: spontaneous breathing, nonlabored ventilation, respiratory function stable and patient connected to nasal cannula oxygen Cardiovascular status: stable and blood pressure returned to baseline Postop Assessment: no apparent nausea or vomiting and spinal receding Anesthetic complications: no    Last Vitals:  Vitals:   01/22/20 1309 01/22/20 2032  BP: (!) 94/52 111/62  Pulse: 99 (!) 103  Resp:  16  Temp: 36.7 C 37.2 C  SpO2: 91% (!) 89%    Last Pain:  Vitals:   01/22/20 2032  TempSrc: Oral  PainSc:                  Gehrig Patras

## 2020-01-22 NOTE — Progress Notes (Signed)
01/22/20 1253  PT Visit Information  Last PT Received On 01/22/20  Assistance Needed +1  History of Present Illness Pt admit for LEFT TOTAL HIP ARTHROPLASTY-DIRECT ANTERIOR.  Bladder injury post op.    Subjective Data  Patient Stated Goal to go home  Precautions  Precautions Fall  Restrictions  Weight Bearing Restrictions Yes  LLE Weight Bearing WBAT  Pain Assessment  Pain Assessment Faces  Faces Pain Scale 8  Pain Location left hip  Pain Descriptors / Indicators Sore  Pain Intervention(s) Limited activity within patient's tolerance;Monitored during session;Repositioned;Patient requesting pain meds-RN notified  Cognition  Arousal/Alertness Awake/alert  Behavior During Therapy WFL for tasks assessed/performed  Overall Cognitive Status Within Functional Limits for tasks assessed  Bed Mobility  Overal bed mobility Needs Assistance  General bed mobility comments in chair on arrival  Transfers  Overall transfer level Needs assistance  Equipment used Rolling walker (2 wheeled)  Transfers Sit to/from Stand  Sit to Stand Mod assist  General transfer comment Mod assist to power up and cues for technique.   Ambulation/Gait  Ambulation/Gait assistance Min assist  Gait Distance (Feet) 20 Feet  Assistive device Rolling walker (2 wheeled)  Gait Pattern/deviations Step-to pattern;Decreased step length - left;Decreased stance time - left;Decreased weight shift to left;Decreased dorsiflexion - left;Antalgic  General Gait Details Pt was able to ambulate with min assist with cues for technique.  Initially assisted pt to move her left LE as she counldnt advance it without help but she got to where she could.  Pt stated she had some dizziness however overall not as much as this am.  BP was still soft.    Gait velocity interpretation <1.31 ft/sec, indicative of household ambulator  Balance  Overall balance assessment Needs assistance  Sitting-balance support No upper extremity supported;Feet  supported  Sitting balance-Leahy Scale Fair  Standing balance support Bilateral upper extremity supported;During functional activity  Standing balance-Leahy Scale Poor  Standing balance comment relies on UE support.   General Comments  General comments (skin integrity, edema, etc.) O2 92% and greater on RA, HR up to 130 bpm, BP 100/45 at end of treatment  Exercises  Exercises General Lower Extremity  General Exercises - Lower Extremity  Ankle Circles/Pumps AROM;Both;5 reps;Supine  Quad Sets AROM;Both;5 reps;Supine  Long Arc Quad AROM;10 reps;Seated;Left  Heel Slides AAROM;Left;10 reps;Seated  Hip ABduction/ADduction AAROM;Left;10 reps;Seated  PT - End of Session  Equipment Utilized During Treatment Gait belt;Oxygen  Activity Tolerance Patient limited by fatigue  Patient left in chair;with call bell/phone within reach;with chair alarm set;with family/visitor present  Nurse Communication Mobility status;Patient requests pain meds   PT - Assessment/Plan  PT Plan Current plan remains appropriate  PT Visit Diagnosis Muscle weakness (generalized) (M62.81);Unsteadiness on feet (R26.81);Pain  Pain - Right/Left Left  Pain - part of body Hip  PT Frequency (ACUTE ONLY) 7X/week  Follow Up Recommendations Home health PT;Supervision/Assistance - 24 hour  PT equipment Rolling walker with 5" wheels;3in1 (PT) (may purchase tub bench )  AM-PAC PT "6 Clicks" Mobility Outcome Measure (Version 2)  Help needed turning from your back to your side while in a flat bed without using bedrails? 2  Help needed moving from lying on your back to sitting on the side of a flat bed without using bedrails? 2  Help needed moving to and from a bed to a chair (including a wheelchair)? 2  Help needed standing up from a chair using your arms (e.g., wheelchair or bedside chair)? 2  Help needed to  walk in hospital room? 2  Help needed climbing 3-5 steps with a railing?  1  6 Click Score 11  Consider Recommendation of  Discharge To: CIR/SNF/LTACH  PT Goal Progression  Progress towards PT goals Progressing toward goals  PT Time Calculation  PT Start Time (ACUTE ONLY) 1153  PT Stop Time (ACUTE ONLY) 1214  PT Time Calculation (min) (ACUTE ONLY) 21 min  PT General Charges  $$ ACUTE PT VISIT 1 Visit  PT Treatments  $Gait Training 8-22 mins  Pt progressing with incr distance this pm with ambulation.  Pt also performed several exercises. Husband present and agrees that pt is doing well. Will ask for OT for adaptive equipment demo as pt wants to be independent with ADLs once she goes home.   Telly Broberg W,PT Acute Rehabilitation Services Pager:  [phone]  Office:  [phone]

## 2020-01-22 NOTE — Progress Notes (Addendum)
Subjective: 1 Day Post-Op Procedure(s) (LRB): LEFT TOTAL HIP ARTHROPLASTY-DIRECT ANTERIOR (Left) Patient reports pain as moderate.    Objective: Vital signs in last 24 hours: Temp:  [97.4 F (36.3 C)-98.2 F (36.8 C)] 98.1 F (36.7 C) (04/13 0419) Pulse Rate:  [63-94] 94 (04/13 0419) Resp:  [13-21] 21 (04/13 0419) BP: (71-152)/(49-99) 128/61 (04/13 0419) SpO2:  [92 %-100 %] 98 % (04/13 0419) Weight:  [61.4 kg] 61.4 kg (04/12 1041)  Intake/Output from previous day: 04/12 0701 - 04/13 0700 In: 3681.7 [P.O.:780; I.V.:1886.7; Blood:315; IV Piggyback:700] Out: 1350 [Urine:350; Blood:1000] Intake/Output this shift: No intake/output data recorded.  Recent Labs    01/22/20 0336  HGB 12.0   Recent Labs    01/22/20 0336  WBC 7.2  RBC 3.96  HCT 35.9*  PLT 97*   Recent Labs    01/22/20 0336  NA 140  K 5.0  CL 107  CO2 28  BUN 13  CREATININE 0.82  GLUCOSE 151*  CALCIUM 8.3*   No results for input(s): LABPT, INR in the last 72 hours.  Neurologically intact DG Cystogram  Result Date: 01/21/2020 CLINICAL DATA:  Hematuria.  Postoperative for left hip surgery. EXAM: CYSTOGRAM TECHNIQUE: After catheterization of the urinary bladder following sterile technique the bladder was filled with 50 mL Cysto-Conray by direct syringe injection of the pre-existing Foley catheter. The Foley catheter was left in place. FLUOROSCOPY TIME:  Fluoroscopy Time:  1 minutes, 18 seconds Radiation Exposure Index (if provided by the fluoroscopic device): 11 mGy Number of Acquired Spot Images: 0 COMPARISON:  Hip radiographs from 01/21/2020 FINDINGS: A total of 50 cc of contrast was injected into the urinary bladder under fluoroscopic visualization. About 30 cc of injection, there was leakage of contrast from the urinary bladder along the left pelvic sidewall which formed a somewhat stellate and irregular collection which do not seem to shift significantly when I turned the patient. Accordingly I favor  this as being most likely extraperitoneal. IMPRESSION: 1. Left-sided leak from the urinary bladder. This formed a somewhat stellate collection which did not shift significantly with gentle turning of the patient, most likely extraperitoneal. These results were called by telephone at the time of interpretation on 01/21/2020 at 5:05 pm to provider Dr. Rodell Perna , who verbally acknowledged these results. These results were also called by telephone at the time of interpretation on 01/21/2020 at 5:15 pm to provider Dr. Jacalyn Lefevre, who verbally acknowledged these results. Electronically Signed   By: Van Clines M.D.   On: 01/21/2020 17:19   DG C-Arm 1-60 Min  Result Date: 01/21/2020 CLINICAL DATA:  Left hip arthroplasty EXAM: OPERATIVE LEFT HIP (WITH PELVIS IF PERFORMED) 1 VIEWS TECHNIQUE: Fluoroscopic spot image(s) were submitted for interpretation post-operatively. COMPARISON:  None FINDINGS: Two fluoroscopic images were obtained during the performance of the procedure and are provided for interpretation only. Left hip arthroplasty is identified in the expected position without signs of acute complication. FLUOROSCOPY TIME:  41 seconds IMPRESSION: 1. Left hip arthroplasty. Electronically Signed   By: Randa Ngo M.D.   On: 01/21/2020 16:06   DG Hip Port Unilat With Pelvis 1V Left  Result Date: 01/21/2020 CLINICAL DATA:  Status post left hip replacement EXAM: DG HIP (WITH OR WITHOUT PELVIS) 1V PORT LEFT COMPARISON:  Intraoperative films from earlier in the same day. FINDINGS: Left hip prosthesis is noted in satisfactory position. No acute bony or soft tissue abnormality is seen. Contrast is seen within a decompressed bladder. IMPRESSION: Left hip replacement Electronically  Signed   By: Inez Catalina M.D.   On: 01/21/2020 17:40   DG HIP OPERATIVE UNILAT W OR W/O PELVIS LEFT  Result Date: 01/21/2020 CLINICAL DATA:  Left hip arthroplasty EXAM: OPERATIVE LEFT HIP (WITH PELVIS IF PERFORMED) 1 VIEWS  TECHNIQUE: Fluoroscopic spot image(s) were submitted for interpretation post-operatively. COMPARISON:  None FINDINGS: Two fluoroscopic images were obtained during the performance of the procedure and are provided for interpretation only. Left hip arthroplasty is identified in the expected position without signs of acute complication. FLUOROSCOPY TIME:  41 seconds IMPRESSION: 1. Left hip arthroplasty. Electronically Signed   By: Randa Ngo M.D.   On: 01/21/2020 16:06    Assessment/Plan: 1 Day Post-Op Procedure(s) (LRB): LEFT TOTAL HIP ARTHROPLASTY-DIRECT ANTERIOR (Left) Up with therapy. Urine clear. Foley times 10 days . OOB with therapy today. D/c telemetry.  Acute blood loss anemia with transfusion of 2 units hgb now 12. She did not need 2nd unit but it was given at the time when she had been hypotensive on Neo drip and concern she might have to have bladder repair when Hgb was 8.8 dropping from 14.2 pre-op.  Likely home tomorrow.   Marybelle Killings 01/22/2020, 7:44 AM

## 2020-01-22 NOTE — TOC Transition Note (Signed)
Transition of Care [facility]) - CM/SW Discharge Note   Patient Details  Name: Cindy Bullock MRN: [id number] Date of Birth: 1951/04/02  [facility]) CM/SW Contact:  Carles Collet, RN Phone Number: 01/22/2020, 3:29 PM   Clinical Narrative:    Spoke to patient and spouse at bedside. They would like to go home w [facility] services, reviewed ratings for zip code, [facility] chosen, referral accepted. RW and 3/1 requested to be delivered to room today.       Barriers to Discharge: Continued Medical Work up   Patient Goals and CMS Choice Patient states their goals for this hospitalization and ongoing recovery are:: to go home CMS Medicare.gov Compare Post Acute Care list provided to:: Patient Choice offered to / list presented to : Patient  Discharge Placement                       Discharge Plan and Services                DME Arranged: 3-N-1, Walker rolling DME Agency: [employer] Date DME Agency Contacted: 01/22/20 Time DME Agency Contacted: 70 Representative spoke with at DME Agency: [facility] HH Arranged: PT, OT HH Agency: [place] Date [place]: 01/22/20 Time [place]: [place] Representative spoke with at [place]: [place]) Interventions     Readmission Risk Interventions No flowsheet data found.

## 2020-01-23 ENCOUNTER — Other Ambulatory Visit: Payer: Self-pay | Admitting: Orthopaedic Surgery

## 2020-01-23 DIAGNOSIS — N9972 Accidental puncture and laceration of a genitourinary system organ or structure during other procedure: Secondary | ICD-10-CM | POA: Diagnosis not present

## 2020-01-23 LAB — POCT I-STAT, CHEM 8
BUN: 13 mg/dL (ref 8–23)
Calcium, Ion: 1.26 mmol/L (ref 1.15–1.40)
Chloride: 105 mmol/L (ref 98–111)
Creatinine, Ser: 0.9 mg/dL (ref 0.44–1.00)
Glucose, Bld: 171 mg/dL — ABNORMAL HIGH (ref 70–99)
HCT: 24 % — ABNORMAL LOW (ref 36.0–46.0)
Hemoglobin: 8.2 g/dL — ABNORMAL LOW (ref 12.0–15.0)
Potassium: 4.5 mmol/L (ref 3.5–5.1)
Sodium: 142 mmol/L (ref 135–145)
TCO2: 29 mmol/L (ref 22–32)

## 2020-01-23 LAB — CBC
HCT: 32.8 % — ABNORMAL LOW (ref 36.0–46.0)
Hemoglobin: 11 g/dL — ABNORMAL LOW (ref 12.0–15.0)
MCH: 30.2 pg (ref 26.0–34.0)
MCHC: 33.5 g/dL (ref 30.0–36.0)
MCV: 90.1 fL (ref 80.0–100.0)
Platelets: 83 10*3/uL — ABNORMAL LOW (ref 150–400)
RBC: 3.64 MIL/uL — ABNORMAL LOW (ref 3.87–5.11)
RDW: 13.3 % (ref 11.5–15.5)
WBC: 9.7 10*3/uL (ref 4.0–10.5)
nRBC: 0 % (ref 0.0–0.2)

## 2020-01-23 MED ORDER — OXYCODONE-ACETAMINOPHEN 5-325 MG PO TABS: 1.0000 | ORAL_TABLET | Freq: Four times a day (QID) | ORAL | 0 refills | Status: DC | PRN

## 2020-01-23 MED ORDER — CEPHALEXIN 500 MG PO CAPS: 500.0000 mg | ORAL_CAPSULE | Freq: Two times a day (BID) | ORAL | 0 refills | Status: AC

## 2020-01-23 MED ORDER — ASPIRIN 325 MG PO TABS: 325.0000 mg | ORAL_TABLET | Freq: Every day | ORAL | Status: DC

## 2020-01-23 MED ORDER — METHOCARBAMOL 500 MG PO TABS: 500.0000 mg | ORAL_TABLET | Freq: Three times a day (TID) | ORAL | 0 refills | Status: DC | PRN

## 2020-01-23 MED ORDER — OXYCODONE-ACETAMINOPHEN 5-325 MG PO TABS: 1.0000 | ORAL_TABLET | Freq: Four times a day (QID) | ORAL | 0 refills | Status: AC | PRN

## 2020-01-23 NOTE — Discharge Instructions (Signed)
Walk daily. Use your walker. You may shower and leave dressing on . It is waterproof. See Dr. Lorin Mercy in 8 days .  INSTRUCTIONS AFTER JOINT REPLACEMENT   o Remove items at home which could result in a fall. This includes throw rugs or furniture in walking pathways o ICE to the affected joint every three hours while awake for 30 minutes at a time, for at least the first 3-5 days, and then as needed for pain and swelling.  Continue to use ice for pain and swelling. You may notice swelling that will progress down to the foot and ankle.  This is normal after surgery.  Elevate your leg when you are not up walking on it.   o Continue to use the breathing machine you got in the hospital (incentive spirometer) which will help keep your temperature down.  It is common for your temperature to cycle up and down following surgery, especially at night when you are not up moving around and exerting yourself.  The breathing machine keeps your lungs expanded and your temperature down.   DIET:  As you were doing prior to hospitalization, we recommend a well-balanced diet.  DRESSING / WOUND CARE / SHOWERING  Keep the surgical dressing until follow up.  The dressing is water proof, so you can shower without any extra covering.  IF THE DRESSING FALLS OFF or the wound gets wet inside, change the dressing with sterile gauze.  Please use good hand washing techniques before changing the dressing.  Do not use any lotions or creams on the incision until instructed by your surgeon.    ACTIVITY  o Increase activity slowly as tolerated, but follow the weight bearing instructions below.   o No driving for 6 weeks or until further direction given by your physician.  You cannot drive while taking narcotics.  o No lifting or carrying greater than 10 lbs. until further directed by your surgeon. o Avoid periods of inactivity such as sitting longer than an hour when not asleep. This helps prevent blood clots.  o You may return to  work once you are authorized by your doctor.     WEIGHT BEARING   Weight bearing as tolerated with assist device (walker, cane, etc) as directed, use it as long as suggested by your surgeon or therapist, typically at least 4-6 weeks.   EXERCISES  Results after joint replacement surgery are often greatly improved when you follow the exercise, range of motion and muscle strengthening exercises prescribed by your doctor. Safety measures are also important to protect the joint from further injury. Any time any of these exercises cause you to have increased pain or swelling, decrease what you are doing until you are comfortable again and then slowly increase them. If you have problems or questions, call your caregiver or physical therapist for advice.   Rehabilitation is important following a joint replacement. After just a few days of immobilization, the muscles of the leg can become weakened and shrink (atrophy).  These exercises are designed to build up the tone and strength of the thigh and leg muscles and to improve motion. Often times heat used for twenty to thirty minutes before working out will loosen up your tissues and help with improving the range of motion but do not use heat for the first two weeks following surgery (sometimes heat can increase post-operative swelling).   These exercises can be done on a training (exercise) mat, on the floor, on a table or on a bed.  Use whatever works the best and is most comfortable for you.    Use music or television while you are exercising so that the exercises are a pleasant break in your day. This will make your life better with the exercises acting as a break in your routine that you can look forward to.   Perform all exercises about fifteen times, three times per day or as directed.  You should exercise both the operative leg and the other leg as well.  Exercises include:    Quad Sets - Tighten up the muscle on the front of the thigh (Quad) and  hold for 5-10 seconds.    Straight Leg Raises - With your knee straight (if you were given a brace, keep it on), lift the leg to 60 degrees, hold for 3 seconds, and slowly lower the leg.  Perform this exercise against resistance later as your leg gets stronger.   Leg Slides: Lying on your back, slowly slide your foot toward your buttocks, bending your knee up off the floor (only go as far as is comfortable). Then slowly slide your foot back down until your leg is flat on the floor again.   Angel Wings: Lying on your back spread your legs to the side as far apart as you can without causing discomfort.   Hamstring Strength:  Lying on your back, push your heel against the floor with your leg straight by tightening up the muscles of your buttocks.  Repeat, but this time bend your knee to a comfortable angle, and push your heel against the floor.  You may put a pillow under the heel to make it more comfortable if necessary.   A rehabilitation program following joint replacement surgery can speed recovery and prevent re-injury in the future due to weakened muscles. Contact your doctor or a physical therapist for more information on knee rehabilitation.    CONSTIPATION  Constipation is defined medically as fewer than three stools per week and severe constipation as less than one stool per week.  Even if you have a regular bowel pattern at home, your normal regimen is likely to be disrupted due to multiple reasons following surgery.  Combination of anesthesia, postoperative narcotics, change in appetite and fluid intake all can affect your bowels.   YOU MUST use at least one of the following options; they are listed in order of increasing strength to get the job done.  They are all available over the counter, and you may need to use some, POSSIBLY even all of these options:    Drink plenty of fluids (prune juice may be helpful) and high fiber foods Colace 100 mg by mouth twice a day  Senokot for  constipation as directed and as needed Dulcolax (bisacodyl), take with full glass of water  Miralax (polyethylene glycol) once or twice a day as needed.  If you have tried all these things and are unable to have a bowel movement in the first 3-4 days after surgery call either your surgeon or your primary doctor.    If you experience loose stools or diarrhea, hold the medications until you stool forms back up.  If your symptoms do not get better within 1 week or if they get worse, check with your doctor.  If you experience "the worst abdominal pain ever" or develop nausea or vomiting, please contact the office immediately for further recommendations for treatment.   ITCHING:  If you experience itching with your medications, try taking only a single pain  pill, or even half a pain pill at a time.  You can also use Benadryl over the counter for itching or also to help with sleep.   TED HOSE STOCKINGS:  Use stockings on both legs until for at least 2 weeks or as directed by physician office. They may be removed at night for sleeping.  MEDICATIONS:  See your medication summary on the After Visit Summary that nursing will review with you.  You may have some home medications which will be placed on hold until you complete the course of blood thinner medication.  It is important for you to complete the blood thinner medication as prescribed.  PRECAUTIONS:  If you experience chest pain or shortness of breath - call 911 immediately for transfer to the hospital emergency department.   If you develop a fever greater that 101 F, purulent drainage from wound, increased redness or drainage from wound, foul odor from the wound/dressing, or calf pain - CONTACT YOUR SURGEON.                                                   FOLLOW-UP APPOINTMENTS:  If you do not already have a post-op appointment, please call the office for an appointment to be seen by your surgeon.  Guidelines for how soon to be seen are listed in  your After Visit Summary, but are typically between 1-4 weeks after surgery.  OTHER INSTRUCTIONS:   Knee Replacement:  Do not place pillow under knee, focus on keeping the knee straight while resting. CPM instructions: 0-90 degrees, 2 hours in the morning, 2 hours in the afternoon, and 2 hours in the evening. Place foam block, curve side up under heel at all times except when in CPM or when walking.  DO NOT modify, tear, cut, or change the foam block in any way.   DENTAL ANTIBIOTICS:  In most cases prophylactic antibiotics for Dental procdeures after total joint surgery are not necessary.  Exceptions are as follows:  1. History of prior total joint infection  2. Severely immunocompromised (Organ Transplant, cancer chemotherapy, Rheumatoid biologic meds such as [place])  3. Poorly controlled diabetes (A1C &gt; 8.0, blood glucose over 200)  If you have one of these conditions, contact your surgeon for an antibiotic prescription, prior to your dental procedure.   MAKE SURE YOU:   Understand these instructions.   Get help right away if you are not doing well or get worse.    Thank you for letting us be a part of your medical care team.  It is a privilege we respect greatly.  We hope these instructions will help you stay on track for a fast and full recovery!

## 2020-01-23 NOTE — TOC Transition Note (Signed)
Transition of Care [facility]) - CM/SW Discharge Note   Patient Details  Name: Cindy Bullock MRN: [id number] Date of Birth: 04-07-1951  Transition of Care [facility]) CM/SW Contact:  Bethena Roys, RN Phone Number: 01/23/2020, 4:57 PM   Clinical Narrative:  Previous Case Manager offered choice for Home health Agency. Order is in for transition home. Message was sent to physician to add home health orders and F2F. Home Health agency [facility] contacted and made aware that patient will transition home today. DME is in the room. No further needs from this Case Manager.     Final Next Level of Care: Home with [place].   Barriers to Discharge: Barriers Resolved   Patient Goals and CMS Choice Patient states their goals for this hospitalization and ongoing recovery are:: to go home CMS Medicare.gov Compare Post Acute Care list provided to:: Patient Choice offered to / list presented to : Patient              DME Arranged: 3-N-1, Walker rolling DME Agency: [employer] Date DME Agency Contacted: 01/22/20 Time DME Agency Contacted: 47 Representative spoke with at DME Agency: zach HH Arranged: PT, OT [place] Agency: [place] Date [place]: 01/22/20 Time [place]: [place] Representative spoke with at [place]: [place] ([place]) Interventions     Readmission Risk Interventions No flowsheet data found.

## 2020-01-23 NOTE — Progress Notes (Signed)
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  Bladder injury post op.      PT Comments    Patient received in recliner, agrees to PT session. Reports pain free at rest. Patient requires min assist for sit to stand from low recliner. Difficulty with L LE advancement with initial gait. Improved as distance increased. Patient is able to ambulate 60 feet this session and stood at sink to brush teeth. Patient is making good progress with functional mobility. She will continue to benefit from skilled PT while here to improve strength, safety and functional independence.       Follow Up Recommendations  Home health PT;Supervision for mobility/OOB     Equipment Recommendations       Recommendations for Other Services       Precautions / Restrictions Precautions Precautions: Anterior Hip;Fall Restrictions Weight Bearing Restrictions: Yes LLE Weight Bearing: Weight bearing as tolerated    Mobility  Bed Mobility Overal bed mobility: Needs Assistance Bed Mobility: Supine to Sit     Supine to sit: Mod assist;HOB elevated     General bed mobility comments: patient received in recliner and remained in recliner  Transfers Overall transfer level: Needs assistance Equipment used: Rolling walker (2 wheeled) Transfers: Sit to/from [facility] Sit to Stand: Min assist Stand pivot transfers: Min assist       General transfer comment: patient requires min assist with sit to stand from low recliner. Cues needed for correct hand placement and technique.  Ambulation/Gait Ambulation/Gait assistance: Min guard Gait Distance (Feet): 60 Feet Assistive device: Rolling walker (2 wheeled) Gait Pattern/deviations: Step-to pattern;Decreased stride length;Decreased weight shift to left;Trunk flexed;Antalgic Gait  velocity: decr   General Gait Details: Patient able to advance L LE independently however difficult initially. Slides left foot rather than taking step. Patient is steady with use of RW and min guard assist.   Stairs             Wheelchair Mobility    Modified Rankin (Stroke Patients Only)       Balance Overall balance assessment: Needs assistance Sitting-balance support: Feet supported;No upper extremity supported Sitting balance-Leahy Scale: Good     Standing balance support: Bilateral upper extremity supported;During functional activity Standing balance-Leahy Scale: Fair Standing balance comment: relies on UE support.                             Cognition Arousal/Alertness: Awake/alert Behavior During Therapy: WFL for tasks assessed/performed Overall Cognitive Status: Within Functional Limits for tasks assessed                                        Exercises Total Joint Exercises Ankle Circles/Pumps: AROM;10 reps;Both Quad Sets: AROM;Both;10 reps Gluteal Sets: AROM;Both;10 reps Heel Slides: AAROM;Left;10 reps Hip ABduction/ADduction: AAROM;Left;10 reps Long Arc Quad: AROM;Left;10 reps    General Comments        Pertinent Vitals/Pain Pain Assessment: 0-10 Pain Score: 6  Faces Pain Scale: Hurts whole lot Pain Location: left hip Pain Descriptors / Indicators: Aching;Grimacing;Discomfort;Sore Pain Intervention(s): Monitored during session;Repositioned    Home Living Family/patient expects to be discharged to:: Private residence Living Arrangements: Spouse/significant other Available Help at Discharge: Family;Available 24 hours/day Type of Home: House  Home Access: Stairs to enter Entrance Stairs-Rails: None Home Layout: One level Home Equipment: None      Prior Function Level of Independence: Independent      Comments: drive, 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)    PT Goals (current goals can now be found in the care plan  section) Acute Rehab PT Goals Patient Stated Goal: to go home PT Goal Formulation: With patient Time For Goal Achievement: 02/05/20 Potential to Achieve Goals: Good Progress towards PT goals: Progressing toward goals    Frequency    7X/week      PT Plan Current plan remains appropriate    Co-evaluation              AM-PAC PT "6 Clicks" Mobility   Outcome Measure  Help needed turning from your back to your side while in a flat bed without using bedrails?: A Lot Help needed moving from lying on your back to sitting on the side of a flat bed without using bedrails?: A Lot Help needed moving to and from a bed to a chair (including a wheelchair)?: A Little Help needed standing up from a chair using your arms (e.g., wheelchair or bedside chair)?: A Little Help needed to walk in hospital room?: A Little Help needed climbing 3-5 steps with a railing? : A Little 6 Click Score: 16    End of Session Equipment Utilized During Treatment: Gait belt Activity Tolerance: Patient tolerated treatment well;Patient limited by pain Patient left: in chair;with call bell/phone within reach;with chair alarm set Nurse Communication: Mobility status PT Visit Diagnosis: Difficulty in walking, not elsewhere classified (R26.2);Muscle weakness (generalized) (M62.81);Pain Pain - Right/Left: Left Pain - part of body: Hip     Time: 1000-1034 PT Time Calculation (min) (ACUTE ONLY): 34 min  Charges:  $Gait Training: 8-22 mins $Therapeutic Exercise: 8-22 mins                     Maela Takeda, PT, GCS 01/23/20,10:47 AM

## 2020-01-23 NOTE — Telephone Encounter (Signed)
Please go in and add directions for Tizanidine.

## 2020-01-23 NOTE — Evaluation (Signed)
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  Bladder injury post op.     Clinical Impression   PTA patient independent and working. Admitted for above and limited by problem list below, including L hip pain, impaired balance, decreased activity tolerance.  She currently requires min-max assist for ADLs, mod assist for bed mobility and min-mod assist using RW for transfers to recliner.  She will have good support from spouse at dc as needed.  Believe she will benefit from further OT services while admitted and after dc at [facility] level to optimize independence and safety with ADLs, IADLs and mobility.     Follow Up Recommendations  Home health OT;Supervision - Intermittent(may progress to no follow up )    Equipment Recommendations  3 in 1 bedside commode;Tub/shower seat    Recommendations for Other Services       Precautions / Restrictions Precautions Precautions: Fall Restrictions Weight Bearing Restrictions: Yes LLE Weight Bearing: Weight bearing as tolerated      Mobility Bed Mobility Overal bed mobility: Needs Assistance Bed Mobility: Supine to Sit     Supine to sit: Mod assist;HOB elevated     General bed mobility comments: cueing for technique and sequencing, requires min assist to scoot with pad and mod assist for trunk support to ascend  Transfers Overall transfer level: Needs assistance Equipment used: Rolling walker (2 wheeled) Transfers: Sit to/from [facility] Sit to Stand: Mod assist Stand pivot transfers: Min assist       General transfer comment: mod assist to power up and steady, cueing for hand placement and technique; once standing requires inital support to progress L LE during pivot then able to complete remainder of pivot with min guard     Balance Overall balance  assessment: Needs assistance Sitting-balance support: No upper extremity supported;Feet supported Sitting balance-Leahy Scale: Fair     Standing balance support: Bilateral upper extremity supported;During functional activity Standing balance-Leahy Scale: Poor Standing balance comment: relies on UE support.                            ADL either performed or assessed with clinical judgement   ADL Overall ADL's : Needs assistance/impaired Eating/Feeding: Modified independent;Sitting   Grooming: Set up;Sitting   Upper Body Bathing: Set up;Sitting   Lower Body Bathing: Moderate assistance;Sit to/from stand   Upper Body Dressing : Set up;Sitting   Lower Body Dressing: Maximal assistance;Sit to/from stand Lower Body Dressing Details (indicate cue type and reason): pt unable to reach feet, relaint on BUE support in standing and mod assist to stand  Toilet Transfer: Moderate assistance;Cueing for sequencing;Stand-pivot;RW Toilet Transfer Details (indicate cue type and reason): simulated to recliner          Functional mobility during ADLs: Moderate assistance;Rolling walker;Cueing for safety;Cueing for sequencing General ADL Comments: pt limited by pain, decreased functional use of L LE, impaired balance      Vision   Vision Assessment?: No apparent visual deficits     Perception     Praxis      Pertinent Vitals/Pain Pain Assessment: 0-10 Faces Pain Scale: Hurts whole lot Pain Location: left hip Pain Descriptors / Indicators: Discomfort;Operative site guarding;Sore Pain Intervention(s): Limited activity within patient's tolerance;Monitored during session;Repositioned;RN gave pain meds during session     Hand Dominance Right  Extremity/Trunk Assessment Upper Extremity Assessment Upper Extremity Assessment: Overall WFL for tasks assessed   Lower Extremity Assessment Lower Extremity Assessment: Defer to PT evaluation   Cervical / Trunk Assessment Cervical  / Trunk Assessment: Normal   Communication Communication Communication: No difficulties   Cognition Arousal/Alertness: Awake/alert Behavior During Therapy: WFL for tasks assessed/performed Overall Cognitive Status: Within Functional Limits for tasks assessed                                     General Comments       Exercises     Shoulder Instructions      Home Living Family/patient expects to be discharged to:: Private residence Living Arrangements: Spouse/significant other Available Help at Discharge: Family;Available 24 hours/day Type of Home: House Home Access: Stairs to enter [employer] of Steps: 1 Entrance Stairs-Rails: None Home Layout: One level     Bathroom Shower/Tub: Teacher, early years/pre: Standard     Home Equipment: None          Prior Functioning/Environment Level of Independence: Independent        Comments: drive, 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)         OT Problem List: Decreased activity tolerance;Impaired balance (sitting and/or standing);Decreased safety awareness;Decreased knowledge of use of DME or AE;Decreased knowledge of precautions;Pain      OT Treatment/Interventions: Self-care/ADL training;DME and/or AE instruction;Therapeutic activities;Balance training;Patient/family education    OT Goals(Current goals can be found in the care plan section) Acute Rehab OT Goals Patient Stated Goal: to go home OT Goal Formulation: With patient Time For Goal Achievement: 02/06/20 Potential to Achieve Goals: Good  OT Frequency: Min 2X/week   Barriers to D/C:            Co-evaluation              AM-PAC OT "6 Clicks" Daily Activity     Outcome Measure Help from another person eating meals?: None Help from another person taking care of personal grooming?: A Little Help from another person toileting, which includes using toliet, bedpan, or urinal?: A Lot Help from another person bathing  (including washing, rinsing, drying)?: A Lot Help from another person to put on and taking off regular upper body clothing?: A Little Help from another person to put on and taking off regular lower body clothing?: A Lot 6 Click Score: 16   End of Session Equipment Utilized During Treatment: Rolling walker Nurse Communication: Mobility status  Activity Tolerance: Patient tolerated treatment well Patient left: in chair;with call bell/phone within reach;with chair alarm set  OT Visit Diagnosis: Other abnormalities of gait and mobility (R26.89);Muscle weakness (generalized) (M62.81);Pain Pain - Right/Left: Left Pain - part of body: Hip                Time: 3557-3220 OT Time Calculation (min): 25 min Charges:  OT General Charges $OT Visit: 1 Visit OT Evaluation $OT Eval Moderate Complexity: 1 Mod OT Treatments $Self Care/Home Management : 8-22 mins  Jolaine Artist, OT Acute Rehabilitation Services Pager [phone] Office [phone]    Delight Stare 01/23/2020, 9:21 AM

## 2020-01-23 NOTE — Plan of Care (Signed)
  Problem: Education: Goal: Knowledge of General Education information will improve Description: Including pain rating scale, medication(s)/side effects and non-pharmacologic comfort measures Outcome: Progressing   Problem: Health Behavior/Discharge Planning: Goal: Ability to manage health-related needs will improve Outcome: Progressing   Problem: Nutrition: Goal: Adequate nutrition will be maintained Outcome: Progressing   Problem: Coping: Goal: Level of anxiety will decrease Outcome: Progressing   Problem: Elimination: Goal: Will not experience complications related to bowel motility Outcome: Progressing   Problem: Pain Managment: Goal: General experience of comfort will improve Outcome: Progressing   Problem: Safety: Goal: Ability to remain free from injury will improve Outcome: Progressing

## 2020-01-23 NOTE — Progress Notes (Signed)
Physical Therapy Treatment Patient Details Name: Cindy Bullock MRN: [id number] DOB: [birth year] Today's Date: 01/23/2020    History of Present Illness Pt admit for LEFT TOTAL HIP ARTHROPLASTY-DIRECT ANTERIOR.  Bladder injury post op.      PT Comments    Patient received in recliner, husband present. Patient reports she is unable to eat much, no appetite.  Agrees to PT session. Patient continues to require min assist for some LE exercises and min guard for sit to stand from recliner. Patient needs min guard for ambulation, cues to look up, take larger steps. Patient tends to slide feet forward rather than lifting for steps. She ambulates at very slow pace. She will continue to benefit from skilled PT while here to improve strength, functional independence and activity tolerance.     Follow Up Recommendations  Home health PT;Supervision for mobility/OOB     Equipment Recommendations       Recommendations for Other Services       Precautions / Restrictions Precautions Precautions: Anterior Hip;Fall Restrictions Weight Bearing Restrictions: No LLE Weight Bearing: Weight bearing as tolerated    Mobility  Bed Mobility               General bed mobility comments: patient received in recliner and remained in recliner  Transfers Overall transfer level: Needs assistance Equipment used: Rolling walker (2 wheeled) Transfers: Sit to/from Stand Sit to Stand: Min guard         General transfer comment: min guard sit to stand from recliner with pillow in seat  Ambulation/Gait Ambulation/Gait assistance: Min guard Gait Distance (Feet): 70 Feet Assistive device: Rolling walker (2 wheeled) Gait Pattern/deviations: Step-to pattern;Decreased stride length;Decreased step length - right;Decreased step length - left;Shuffle Gait velocity: decr   General Gait Details: Patient able to advance L LE independently however difficult initially. Slides left foot rather than taking step.  Patient is steady with use of RW and min guard assist.   Stairs             Wheelchair Mobility    Modified Rankin (Stroke Patients Only)       Balance Overall balance assessment: Modified Independent Sitting-balance support: Feet supported;No upper extremity supported Sitting balance-Leahy Scale: Good     Standing balance support: Bilateral upper extremity supported;During functional activity Standing balance-Leahy Scale: Good Standing balance comment: relies on UE support more due to pain than decreased balance                            Cognition Arousal/Alertness: Awake/alert Behavior During Therapy: WFL for tasks assessed/performed Overall Cognitive Status: Within Functional Limits for tasks assessed                                        Exercises Total Joint Exercises Ankle Circles/Pumps: AROM;10 reps;Both Quad Sets: AROM;10 reps;Both Gluteal Sets: AROM;10 reps;Both Heel Slides: AAROM;Left;10 reps Hip ABduction/ADduction: AAROM;Left;10 reps Long Arc Quad: AROM;10 reps;Left    General Comments        Pertinent Vitals/Pain Pain Assessment: 0-10 Pain Score: 6  Pain Location: left hip Pain Descriptors / Indicators: Sore Pain Intervention(s): Monitored during session    Home Living                      Prior Function  PT Goals (current goals can now be found in the care plan section) Acute Rehab PT Goals Patient Stated Goal: to go home PT Goal Formulation: With patient Time For Goal Achievement: 02/05/20 Potential to Achieve Goals: Good Progress towards PT goals: Progressing toward goals    Frequency    7X/week      PT Plan Current plan remains appropriate    Co-evaluation              AM-PAC PT "6 Clicks" Mobility   Outcome Measure  Help needed turning from your back to your side while in a flat bed without using bedrails?: A Lot Help needed moving from lying on your back to  sitting on the side of a flat bed without using bedrails?: A Lot Help needed moving to and from a bed to a chair (including a wheelchair)?: A Little Help needed standing up from a chair using your arms (e.g., wheelchair or bedside chair)?: A Little Help needed to walk in hospital room?: A Little Help needed climbing 3-5 steps with a railing? : A Little 6 Click Score: 16    End of Session Equipment Utilized During Treatment: Gait belt Activity Tolerance: Patient tolerated treatment well Patient left: in chair;with call bell/phone within reach;with chair alarm set Nurse Communication: Mobility status PT Visit Diagnosis: Difficulty in walking, not elsewhere classified (R26.2);Muscle weakness (generalized) (M62.81);Pain Pain - Right/Left: Left Pain - part of body: Hip     Time: 5615-3794 PT Time Calculation (min) (ACUTE ONLY): 33 min  Charges:  $Gait Training: 8-22 mins $Therapeutic Exercise: 8-22 mins                     Dantae Meunier, PT, GCS 01/23/20,2:59 PM

## 2020-01-23 NOTE — Progress Notes (Signed)
   Subjective: 2 Days Post-Op Procedure(s) (LRB): LEFT TOTAL HIP ARTHROPLASTY-DIRECT ANTERIOR (Left) Patient reports pain as mild. " I walked in the hall twice, I want to go home ".   Objective: Vital signs in last 24 hours: Temp:  [98.1 F (36.7 C)-99 F (37.2 C)] 98.1 F (36.7 C) (04/14 1339) Pulse Rate:  [70-115] 97 (04/14 1345) Resp:  [16-20] 20 (04/14 1342) BP: (110-118)/(50-63) 110/63 (04/14 1342) SpO2:  [89 %] 89 % (04/14 1342) Weight:  [60 kg] 60 kg (04/14 0500)  Intake/Output from previous day: 04/13 0701 - 04/14 0700 In: 538.9 [P.O.:360; I.V.:178.9] Out: 700 [Urine:700] Intake/Output this shift: No intake/output data recorded.  Recent Labs    01/21/20 1548 01/22/20 0336 01/23/20 0348  HGB 8.2* 12.0 11.0*   Recent Labs    01/22/20 0336 01/23/20 0348  WBC 7.2 9.7  RBC 3.96 3.64*  HCT 35.9* 32.8*  PLT 97* 83*   Recent Labs    01/21/20 1548 01/22/20 0336  NA 142 140  K 4.5 5.0  CL 105 107  CO2  --  28  BUN 13 13  CREATININE 0.90 0.82  GLUCOSE 171* 151*  CALCIUM  --  8.3*   No results for input(s): LABPT, INR in the last 72 hours.  Neurologically intact. LL equal.  No results found.  Assessment/Plan: 2 Days Post-Op Procedure(s) (LRB): LEFT TOTAL HIP ARTHROPLASTY-DIRECT ANTERIOR (Left) Discharge home with home health. Office 8 days [facility].   Marybelle Killings 01/23/2020, 4:24 PM

## 2020-01-23 NOTE — Progress Notes (Signed)
   Subjective: 2 Days Post-Op Procedure(s) (LRB): LEFT TOTAL HIP ARTHROPLASTY-DIRECT ANTERIOR (Left) Patient reports pain as moderate.    Objective: Vital signs in last 24 hours: Temp:  [98 F (36.7 C)-99 F (37.2 C)] 99 F (37.2 C) (04/14 0500) Pulse Rate:  [98-108] 108 (04/14 0500) Resp:  [16-18] 16 (04/14 0500) BP: (94-118)/(50-70) 118/50 (04/14 0500) SpO2:  [89 %-97 %] 89 % (04/14 0500) Weight:  [60 kg] 60 kg (04/14 0500)  Intake/Output from previous day: 04/13 0701 - 04/14 0700 In: 538.9 [P.O.:360; I.V.:178.9] Out: 700 [Urine:700] Intake/Output this shift: No intake/output data recorded.  Recent Labs    01/21/20 1548 01/22/20 0336 01/23/20 0348  HGB 8.2* 12.0 11.0*   Recent Labs    01/22/20 0336 01/23/20 0348  WBC 7.2 9.7  RBC 3.96 3.64*  HCT 35.9* 32.8*  PLT 97* 83*   Recent Labs    01/21/20 1548 01/22/20 0336  NA 142 140  K 4.5 5.0  CL 105 107  CO2  --  28  BUN 13 13  CREATININE 0.90 0.82  GLUCOSE 171* 151*  CALCIUM  --  8.3*   No results for input(s): LABPT, INR in the last 72 hours.  Neurologically intact No results found.  Assessment/Plan: 2 Days Post-Op Procedure(s) (LRB): LEFT TOTAL HIP ARTHROPLASTY-DIRECT ANTERIOR (Left) Up with therapy. Made it to the sink yesterday. Has not been out to the hall . Continue PT today. Home once mobile. Has one step to get into the house.   Cindy Bullock 01/23/2020, 7:57 AM

## 2020-01-24 ENCOUNTER — Telehealth: Payer: Self-pay | Admitting: Radiology

## 2020-01-24 ENCOUNTER — Telehealth: Payer: Self-pay | Admitting: *Deleted

## 2020-01-24 LAB — BPAM RBC
Blood Product Expiration Date: 202105042359
Blood Product Expiration Date: 202105072359
Blood Product Expiration Date: 202105072359
ISSUE DATE / TIME: 202104121706
ISSUE DATE / TIME: 202104121812
Unit Type and Rh: 6200
Unit Type and Rh: 6200
Unit Type and Rh: 6200

## 2020-01-24 LAB — TYPE AND SCREEN
ABO/RH(D): A POS
Antibody Screen: NEGATIVE
Unit division: 0
Unit division: 0
Unit division: 0

## 2020-01-24 NOTE — Telephone Encounter (Signed)
I will work on it now.

## 2020-01-24 NOTE — Telephone Encounter (Signed)
One po q 8 hrs prn spasm  # 30 thanks

## 2020-01-24 NOTE — Telephone Encounter (Signed)
RNCM asked to assist with [facility] needs for patient after discharge yesterday after Left THA with Dr. Lorin Mercy. Reviewed hospital CM notes and called [facility] to confirm referral for HHPT and SN was received. Mariann Laster with [facility] agency confirmed and informed that they could admit to nursing on Sunday and PT would begin on Monday. Confirmed this is agreeable with MD. Call to patient and updated.

## 2020-01-24 NOTE — Telephone Encounter (Signed)
I spoke with patient who is doing well. She states that she is not having any problems and is getting along fine. We discussed home health physical therapy coming out and what they would do. Also discussed one time visit by nurse to check on foley cath. Patient aware they will call her to set this up.

## 2020-01-24 NOTE — Telephone Encounter (Signed)
Could you take care of sending this referral for me and calling the patient to let her know?  I am in [place] office today with limited access to some of my stuff. Thanks.

## 2020-01-24 NOTE — Telephone Encounter (Signed)
Sent to pharmacy 

## 2020-01-24 NOTE — Telephone Encounter (Signed)
Per Dr. Lorin Mercy, please set up HHPT 3x week for ambulation with walker. Also, RN visit x one. Patient has foley cath. Set up with [facility].

## 2020-01-31 ENCOUNTER — Other Ambulatory Visit: Payer: Self-pay

## 2020-01-31 ENCOUNTER — Ambulatory Visit (INDEPENDENT_AMBULATORY_CARE_PROVIDER_SITE_OTHER): Payer: Medicare Other | Admitting: Orthopaedic Surgery

## 2020-01-31 ENCOUNTER — Encounter: Payer: Self-pay | Admitting: Orthopaedic Surgery

## 2020-01-31 ENCOUNTER — Ambulatory Visit (INDEPENDENT_AMBULATORY_CARE_PROVIDER_SITE_OTHER): Payer: Medicare Other

## 2020-01-31 VITALS — Ht 62.0 in | Wt 132.0 lb

## 2020-01-31 DIAGNOSIS — Z96642 Presence of left artificial hip joint: Secondary | ICD-10-CM | POA: Diagnosis not present

## 2020-01-31 DIAGNOSIS — N9972 Accidental puncture and laceration of a genitourinary system organ or structure during other procedure: Secondary | ICD-10-CM

## 2020-01-31 NOTE — Progress Notes (Signed)
   Post-Op Visit Note   Patient: Cindy Bullock           Date of Birth: 1951-01-03           MRN: [id number] Visit Date: 01/31/2020 PCP: Dairl Ponder, MD   Assessment & Plan: Patient had a Foley catheter removed at the urologist.  She is voiding well without problems.  Staples removed from her hip no pain in her hip she is off pain medication for 4 days.  X-ray showed good position alignment of hip.  She can continue weightbearing as tolerated and I will recheck her in 4 weeks.  She is happy with the results of surgery.  Chief Complaint:  Chief Complaint  Patient presents with  . Left Hip - Routine Post Op    01/21/2020 Left THA   Visit Diagnoses:  1. Status post total hip replacement, left   2. Hx of total hip arthroplasty, left   3. Bladder perforation, intraoperative     Plan: Post total hip arthroplasty with Intra-Op bladder perforation injury.  She is voiding well on her own incision looks good staples removed.  Good relief her preop pain.  She is off her pain medication for 4 days.  Progressive weightbearing return 4 weeks no x-ray needed on return.  Follow-Up Instructions: Return in about 4 weeks (around 02/28/2020).   Orders:  Orders Placed This Encounter  Procedures  . XR HIP UNILAT W OR W/O PELVIS 2-3 VIEWS LEFT   No orders of the defined types were placed in this encounter.   Imaging: XR HIP UNILAT W OR W/O PELVIS 2-3 VIEWS LEFT  Result Date: 01/31/2020 Standing AP pelvis and frog-leg lateral demonstrates total hip arthroplasty on the left leg lengths are equal.  Good position alignment total hip arthroplasty 2 acetabular screws are noted. Impression: Satisfactory left total of arthroplasty.   PMFS History: Patient Active Problem List   Diagnosis Date Noted  . Hx of total hip arthroplasty, left 01/31/2020  . Bladder perforation, intraoperative 01/23/2020  . COPD (chronic obstructive pulmonary disease) ([place])   . Lung mass    Past Medical History:    Diagnosis Date  . Arthritis of left hip   . COPD (chronic obstructive pulmonary disease) ([place])   . Hypertension   . Lung mass    lul  . SOB (shortness of breath) on exertion    01/16/2020: per patient short of breath sometimes  . Tobacco abuse   . Umbilical hernia     No family history on file.  Past Surgical History:  Procedure Laterality Date  . As video bronchoscopy.  08/04/2007  . Left VATS, left upper lobectomy.  07/31/2007  . TOTAL HIP ARTHROPLASTY Left 01/21/2020   Procedure: LEFT TOTAL HIP ARTHROPLASTY-DIRECT ANTERIOR;  Surgeon: Marybelle Killings, MD;  Location: [facility];  Service: Orthopedics;  Laterality: Left;  . TUBAL LIGATION  1994   Social History   Occupational History  . Not on file  Tobacco Use  . Smoking status: Former Smoker    Types: Cigarettes    Quit date: 10/11/2008    Years since quitting: 11.3  . Smokeless tobacco: Never Used  . Tobacco comment: 01/16/2020: per patient quit back in 2008  Substance and Sexual Activity  . Alcohol use: Yes    Comment: not on a regular basis  . Drug use: No  . Sexual activity: Not on file

## 2020-02-01 NOTE — Discharge Summary (Addendum)
Patient ID: Cindy Bullock MRN: [id number] DOB/AGE: 69-07-1951 69 y.o.  Admit date: 01/21/2020 Discharge date: 02/01/2020  Admission Diagnoses:  Active Problems:   Bladder perforation, intraoperative   Discharge Diagnoses:  Active Problems:   Bladder perforation, intraoperative  status post Procedure(s): LEFT TOTAL HIP ARTHROPLASTY-DIRECT ANTERIOR  Past Medical History:  Diagnosis Date   Arthritis of left hip    COPD (chronic obstructive pulmonary disease) ([facility])    Hypertension    Lung mass    lul   SOB (shortness of breath) on exertion    01/16/2020: per patient short of breath sometimes   Tobacco abuse    Umbilical hernia     Surgeries: Procedure(s): LEFT TOTAL HIP ARTHROPLASTY-DIRECT ANTERIOR on 01/21/2020   Consultants: Treatment Team:  Robley Fries, MD  Discharged Condition: Improved  Hospital Course: Cindy Bullock is an 69 y.o. female who was admitted 01/21/2020 for operative treatment of left hip arthritis. Patient failed conservative treatments (please see the history and physical for the specifics) and had severe unremitting pain that affects sleep, daily activities and work/hobbies. After pre-op clearance, the patient was taken to the operating room on 01/21/2020 and underwent  Procedure(s): LEFT TOTAL HIP ARTHROPLASTY-DIRECT ANTERIOR.    Intraoperatively patient had bladder penetration from drill to the medial wall of the pelvis.  Patient had Foley catheter placed after closure of the hip and bloody urine was noted and urology service was promptly consulted.  Dr. Claudia Desanctis saw patient and emergent cystogram was performed that showed tiny penetration.  Recommendation was Foley catheter stay in 10 days and antibiotic coverage until the catheter was removed.  Follow-up with Dr. Claudia Desanctis was made at the time of discharge.  Injury was discussed with patient and husband.  Patient was given perioperative antibiotics:  Anti-infectives (From admission, onward)    Start      Dose/Rate Route Frequency Ordered Stop   01/23/20 0000  cephALEXin (KEFLEX) 500 MG capsule     500 mg Oral 2 times daily 01/23/20 1632 02/02/20 2359   01/21/20 2200  ceFAZolin (ANCEF) IVPB 1 g/50 mL premix  Status:  Discontinued     1 g 100 mL/hr over 30 Minutes Intravenous Every 8 hours 01/21/20 1801 01/21/20 2020   01/21/20 1030  ceFAZolin (ANCEF) IVPB 2g/100 mL premix     2 g 200 mL/hr over 30 Minutes Intravenous On call to O.R. 01/21/20 1026 01/21/20 1229        Patient was given sequential compression devices and early ambulation to prevent DVT.   Patient benefited maximally from hospital stay and there were no complications. At the time of discharge, the patient was urinating/moving their bowels without difficulty, tolerating a regular diet, pain is controlled with oral pain medications and they have been cleared by PT/OT.   Recent vital signs: No data found.   Recent laboratory studies: No results for input(s): WBC, HGB, HCT, PLT, NA, K, CL, CO2, BUN, CREATININE, GLUCOSE, INR, CALCIUM in the last 72 hours.  Invalid input(s): PT, 2   Discharge Medications:   Allergies as of 01/23/2020   No Known Allergies      Medication List     STOP taking these medications    meloxicam 15 MG tablet Commonly known as: MOBIC       TAKE these medications    aspirin 325 MG tablet Commonly known as: Bayer Aspirin Take 1 tablet (325 mg total) by mouth daily. Take one daily to help prevent blood clots for 4 wks then  you may stop   atorvastatin 10 MG tablet Commonly known as: LIPITOR Take 10 mg by mouth daily.   cephALEXin 500 MG capsule Commonly known as: KEFLEX Take 1 capsule (500 mg total) by mouth 2 (two) times daily for 10 days. Take until the doctor takes out your foley catheter   levothyroxine 50 MCG tablet Commonly known as: SYNTHROID Take 50 mcg by mouth daily before breakfast.   lisinopril 20 MG tablet Commonly known as: ZESTRIL Take 10 mg by mouth daily.    oxyCODONE-acetaminophen 5-325 MG tablet Commonly known as: Percocet Take 1-2 tablets by mouth every 6 (six) hours as needed for severe pain. Post op pain. Hip replacement        Diagnostic Studies: DG Chest 2 View  Result Date: 01/16/2020 CLINICAL DATA:  Pre-op for total hip replacement. History of partial left lung resection. EXAM: CHEST - 2 VIEW COMPARISON:  Radiographs 12/02/2009. CT 08/18/2011. FINDINGS: The heart size and mediastinal contours are stable. There is stable volume loss and pleural thickening superiorly in the left hemithorax related to remote left upper lobe resection. The lungs are otherwise clear. There is no pleural effusion or pneumothorax. No acute osseous findings. IMPRESSION: Stable postoperative chest. No acute findings. Electronically Signed   By: Richardean Sale M.D.   On: 01/16/2020 14:31   DG Cystogram  Result Date: 01/21/2020 CLINICAL DATA:  Hematuria.  Postoperative for left hip surgery. EXAM: CYSTOGRAM TECHNIQUE: After catheterization of the urinary bladder following sterile technique the bladder was filled with 50 mL Cysto-Conray by direct syringe injection of the pre-existing Foley catheter. The Foley catheter was left in place. FLUOROSCOPY TIME:  Fluoroscopy Time:  1 minutes, 18 seconds Radiation Exposure Index (if provided by the fluoroscopic device): 11 mGy Number of Acquired Spot Images: 0 COMPARISON:  Hip radiographs from 01/21/2020 FINDINGS: A total of 50 cc of contrast was injected into the urinary bladder under fluoroscopic visualization. About 30 cc of injection, there was leakage of contrast from the urinary bladder along the left pelvic sidewall which formed a somewhat stellate and irregular collection which do not seem to shift significantly when I turned the patient. Accordingly I favor this as being most likely extraperitoneal. IMPRESSION: 1. Left-sided leak from the urinary bladder. This formed a somewhat stellate collection which did not shift  significantly with gentle turning of the patient, most likely extraperitoneal. These results were called by telephone at the time of interpretation on 01/21/2020 at 5:05 pm to provider Dr. Rodell Perna , who verbally acknowledged these results. These results were also called by telephone at the time of interpretation on 01/21/2020 at 5:15 pm to provider Dr. Jacalyn Lefevre, who verbally acknowledged these results. Electronically Signed   By: Van Clines M.D.   On: 01/21/2020 17:19   DG C-Arm 1-60 Min  Result Date: 01/21/2020 CLINICAL DATA:  Left hip arthroplasty EXAM: OPERATIVE LEFT HIP (WITH PELVIS IF PERFORMED) 1 VIEWS TECHNIQUE: Fluoroscopic spot image(s) were submitted for interpretation post-operatively. COMPARISON:  None FINDINGS: Two fluoroscopic images were obtained during the performance of the procedure and are provided for interpretation only. Left hip arthroplasty is identified in the expected position without signs of acute complication. FLUOROSCOPY TIME:  41 seconds IMPRESSION: 1. Left hip arthroplasty. Electronically Signed   By: Randa Ngo M.D.   On: 01/21/2020 16:06   DG Hip Port Unilat With Pelvis 1V Left  Result Date: 01/21/2020 CLINICAL DATA:  Status post left hip replacement EXAM: DG HIP (WITH OR WITHOUT PELVIS) 1V PORT LEFT  COMPARISON:  Intraoperative films from earlier in the same day. FINDINGS: Left hip prosthesis is noted in satisfactory position. No acute bony or soft tissue abnormality is seen. Contrast is seen within a decompressed bladder. IMPRESSION: Left hip replacement Electronically Signed   By: Inez Catalina M.D.   On: 01/21/2020 17:40   DG HIP OPERATIVE UNILAT W OR W/O PELVIS LEFT  Result Date: 01/21/2020 CLINICAL DATA:  Left hip arthroplasty EXAM: OPERATIVE LEFT HIP (WITH PELVIS IF PERFORMED) 1 VIEWS TECHNIQUE: Fluoroscopic spot image(s) were submitted for interpretation post-operatively. COMPARISON:  None FINDINGS: Two fluoroscopic images were obtained during the  performance of the procedure and are provided for interpretation only. Left hip arthroplasty is identified in the expected position without signs of acute complication. FLUOROSCOPY TIME:  41 seconds IMPRESSION: 1. Left hip arthroplasty. Electronically Signed   By: Randa Ngo M.D.   On: 01/21/2020 16:06   XR HIP UNILAT W OR W/O PELVIS 2-3 VIEWS LEFT  Result Date: 01/31/2020 Standing AP pelvis and frog-leg lateral demonstrates total hip arthroplasty on the left leg lengths are equal.  Good position alignment total hip arthroplasty 2 acetabular screws are noted. Impression: Satisfactory left total of arthroplasty.      Follow-up Information     Robley Fries, MD Follow up in 8 day(s).   Specialty: Urology Why: call for appt to see Dr. Claudia Desanctis in 8-9 days. request AM appointment time .  Contact information: [address] [place] [address] [phone]         Care, [facility] Follow up.   Why: Registered Nurse, Physical Therapy, Occupational Therapy- office to conatact with visit times.  Contact information: [place] [address] [phone]         Marybelle Killings, MD Follow up in 8 day(s).   Specialty: Orthopedic Surgery Why: see Dr. Lorin Mercy in [place] clinic next Thursday.  Contact information: [address] [place] [address] [phone]            Discharge Plan:  discharge to home  Disposition:     Signed: Benjiman Core  02/01/2020, 3:16 PM

## 2020-02-28 ENCOUNTER — Other Ambulatory Visit: Payer: Self-pay

## 2020-02-28 ENCOUNTER — Ambulatory Visit (INDEPENDENT_AMBULATORY_CARE_PROVIDER_SITE_OTHER): Payer: Medicare Other | Admitting: Orthopaedic Surgery

## 2020-02-28 ENCOUNTER — Encounter: Payer: Self-pay | Admitting: Orthopaedic Surgery

## 2020-02-28 VITALS — BP 138/84 | HR 90 | Ht 63.0 in | Wt 132.0 lb

## 2020-02-28 DIAGNOSIS — Z96642 Presence of left artificial hip joint: Secondary | ICD-10-CM

## 2020-02-28 NOTE — Progress Notes (Signed)
   Post-Op Visit Note   Patient: Cindy Bullock           Date of Birth: Mar 08, 1951           MRN: [id number] Visit Date: 02/28/2020 PCP: Dairl Ponder, MD   Assessment & Plan: Post left total hip arthroplasty 01/21/2020.  Patient can ambulate without her cane but has been using it some.  She is walking better has had trace swelling in her left knee incision looks good.  No problems with her bladder which had Intra-Op complication of bladder perforation likely from the drill.  Patient is voiding well.  She will continue increasing her walking distance she can drive and I will check her back again in a month.  Chief Complaint:  Chief Complaint  Patient presents with  . Left Hip - Follow-up    01/21/2020 Left THA   Visit Diagnoses:  1. Hx of total hip arthroplasty, left     Plan: Return 4 weeks for recheck.  Follow-Up Instructions: Return in about 1 month (around 03/30/2020).   Orders:  No orders of the defined types were placed in this encounter.  No orders of the defined types were placed in this encounter.   Imaging: No results found.  PMFS History: Patient Active Problem List   Diagnosis Date Noted  . Hx of total hip arthroplasty, left 01/31/2020  . Bladder perforation, intraoperative 01/23/2020  . COPD (chronic obstructive pulmonary disease) ([place])   . Lung mass    Past Medical History:  Diagnosis Date  . Arthritis of left hip   . COPD (chronic obstructive pulmonary disease) ([place])   . Hypertension   . Lung mass    lul  . SOB (shortness of breath) on exertion    01/16/2020: per patient short of breath sometimes  . Tobacco abuse   . Umbilical hernia     No family history on file.  Past Surgical History:  Procedure Laterality Date  . As video bronchoscopy.  08/04/2007  . Left VATS, left upper lobectomy.  07/31/2007  . TOTAL HIP ARTHROPLASTY Left 01/21/2020   Procedure: LEFT TOTAL HIP ARTHROPLASTY-DIRECT ANTERIOR;  Surgeon: Marybelle Killings, MD;  Location: [facility];   Service: Orthopedics;  Laterality: Left;  . TUBAL LIGATION  1994   Social History   Occupational History  . Not on file  Tobacco Use  . Smoking status: Former Smoker    Types: Cigarettes    Quit date: 10/11/2008    Years since quitting: 11.3  . Smokeless tobacco: Never Used  . Tobacco comment: 01/16/2020: per patient quit back in 2008  Substance and Sexual Activity  . Alcohol use: Yes    Comment: not on a regular basis  . Drug use: No  . Sexual activity: Not on file

## 2020-03-27 ENCOUNTER — Ambulatory Visit: Payer: Self-pay

## 2020-03-27 ENCOUNTER — Ambulatory Visit (INDEPENDENT_AMBULATORY_CARE_PROVIDER_SITE_OTHER): Payer: Medicare Other | Admitting: Orthopaedic Surgery

## 2020-03-27 ENCOUNTER — Other Ambulatory Visit: Payer: Self-pay

## 2020-03-27 ENCOUNTER — Encounter: Payer: Self-pay | Admitting: Orthopaedic Surgery

## 2020-03-27 VITALS — BP 185/102 | HR 75 | Ht 63.0 in | Wt 131.0 lb

## 2020-03-27 DIAGNOSIS — Z96642 Presence of left artificial hip joint: Secondary | ICD-10-CM

## 2020-03-27 NOTE — Progress Notes (Signed)
° °  Post-Op Visit Note   Patient: Cindy Bullock           Date of Birth: 1951-09-06           MRN: [id number] Visit Date: 03/27/2020 PCP: Dairl Ponder, MD   Assessment & Plan: Patient legs by x-ray standing shows equal leg lengths.  She has some pelvic obliquity and standing a PE lumbar demonstrates lumbar curvature with pelvic obliquity.  She can try heel lift which was applied underneath her right heel.  We discussed it with her hip arthritis she lost some length which is been restored now after the surgery.  She is gotten good relief her preop pain is ambulating without cane and only has trace limp.  She is happy the surgical result.  Chief Complaint:  Chief Complaint  Patient presents with   Left Hip - Follow-up    01/21/2020 Left THA   Visit Diagnoses:  1. Hx of total hip arthroplasty, left     Plan: Gradual increase walking distance and activity level.  Heel lift she can use intermittently in her right shoe.  We reviewed the x-rays and discussed that the apparent leg length inequality is due to pelvic obliquity.  Recheck patient and 4 months.  Follow-Up Instructions: Return in about 4 months (around 07/27/2020).   Orders:  Orders Placed This Encounter  Procedures   XR Lumbar Spine 2-3 Views   No orders of the defined types were placed in this encounter.   Imaging: XR Lumbar Spine 2-3 Views  Result Date: 03/27/2020 Standing AP lumbar spine shows some pelvic obliquity with some lumbar curvature less than 20 degrees with mild facet arthropathy.  Previous left total hip arthroplasty noted. Impression: Mild lumbar curvature with pelvic obliquity.   PMFS History: Patient Active Problem List   Diagnosis Date Noted   Hx of total hip arthroplasty, left 01/31/2020   Bladder perforation, intraoperative 01/23/2020   COPD (chronic obstructive pulmonary disease) (HCC)    Lung mass    Past Medical History:  Diagnosis Date   Arthritis of left hip    COPD (chronic  obstructive pulmonary disease) (HCC)    Hypertension    Lung mass    lul   SOB (shortness of breath) on exertion    01/16/2020: per patient short of breath sometimes   Tobacco abuse    Umbilical hernia     No family history on file.  Past Surgical History:  Procedure Laterality Date   As video bronchoscopy.  08/04/2007   Left VATS, left upper lobectomy.  07/31/2007   TOTAL HIP ARTHROPLASTY Left 01/21/2020   Procedure: LEFT TOTAL HIP ARTHROPLASTY-DIRECT ANTERIOR;  Surgeon: Marybelle Killings, MD;  Location: [place];  Service: Orthopedics;  Laterality: Left;   TUBAL LIGATION  1994   Social History   Occupational History   Not on file  Tobacco Use   Smoking status: Former Smoker    Types: Cigarettes    Quit date: 10/11/2008    Years since quitting: 11.4   Smokeless tobacco: Never Used   Tobacco comment: 01/16/2020: per patient quit back in 2008  Substance and Sexual Activity   Alcohol use: Yes    Comment: not on a regular basis   Drug use: No   Sexual activity: Not on file

## 2020-07-24 ENCOUNTER — Ambulatory Visit: Payer: Medicare Other | Admitting: Orthopaedic Surgery

## 2020-08-07 ENCOUNTER — Encounter: Payer: Self-pay | Admitting: Orthopaedic Surgery

## 2020-08-07 ENCOUNTER — Ambulatory Visit (INDEPENDENT_AMBULATORY_CARE_PROVIDER_SITE_OTHER): Payer: Medicare Other | Admitting: Orthopaedic Surgery

## 2020-08-07 ENCOUNTER — Other Ambulatory Visit: Payer: Self-pay

## 2020-08-07 VITALS — Ht 63.0 in | Wt 134.0 lb

## 2020-08-07 DIAGNOSIS — Z96642 Presence of left artificial hip joint: Secondary | ICD-10-CM | POA: Diagnosis not present

## 2020-08-07 NOTE — Progress Notes (Signed)
Office Visit Note   Patient: Cindy Bullock           Date of Birth: 1951-05-02           MRN: [id number] Visit Date: 08/07/2020              Requested by: Dairl Ponder, MD [place],  [place] PCP: Dairl Ponder, MD   Assessment & Plan: Visit Diagnoses: Post left total hip arthroplasty.  Plan: Patient is happy the surgical result we will check her back again on as-needed basis.  We reviewed pelvis x-rays showing other leg lengths are identical better pelvic obliquity gives her the feeling of apparent leg length discrepancy.  She can use a lift if she wants to get a thinner insert.  Return as needed.  Follow-Up Instructions: No follow-ups on file.   Orders:  No orders of the defined types were placed in this encounter.  No orders of the defined types were placed in this encounter.     Procedures: No procedures performed   Clinical Data: No additional findings.   Subjective: Post left total hip arthroplasty 01/21/2019  HPI patient returns post left total hip arthroplasty 01/21/2019.  30-month checkup she is walking well.  She has some scoliosis with pelvic obliquity and we gave her a lift to wear in her right shoe but she states that it is a little bit tall and sometimes makes her heel rub on her tennis shoe.  With boots he does find sometimes she wears crocs without anything and sometimes uses a lift when she wants to.  She has some sensitivity of the incision no numbness or tingling next to it.  She is very happy with the surgical result can walk with a rapid pace no problems with endurance.  Review of Systems all other systems are unchanged since surgery.   Objective: Vital Signs: Ht 5\' 3"  (1.6 m)   Wt 134 lb (60.8 kg)   BMI 23.74 kg/m   Physical Exam Constitutional:      Appearance: She is well-developed.  HENT:     Head: Normocephalic.     Right Ear: External ear normal.     Left Ear: External ear normal.  Eyes:     Pupils: Pupils are  equal, round, and reactive to light.  Neck:     Thyroid: No thyromegaly.     Trachea: No tracheal deviation.  Cardiovascular:     Rate and Rhythm: Normal rate.  Pulmonary:     Effort: Pulmonary effort is normal.  Abdominal:     Palpations: Abdomen is soft.  Skin:    General: Skin is warm and dry.  Neurological:     Mental Status: She is alert and oriented to person, place, and time.  Psychiatric:        Behavior: Behavior normal.     Ortho Exam negative logroll incisions well-healed she has slight tenderness the incisions well-healed in the inferior aspect she has 1 area with subtendinous tissue has slight dimple.  Mild right lumbar curvature.  Specialty Comments:  No specialty comments available.  Imaging: No results found.   PMFS History: Patient Active Problem List   Diagnosis Date Noted  . Hx of total hip arthroplasty, left 01/31/2020  . Bladder perforation, intraoperative 01/23/2020  . COPD (chronic obstructive pulmonary disease) ([place])   . Lung mass    Past Medical History:  Diagnosis Date  . Arthritis of left hip   . COPD (chronic obstructive pulmonary disease) (  [place])   . Hypertension   . Lung mass    lul  . SOB (shortness of breath) on exertion    01/16/2020: per patient short of breath sometimes  . Tobacco abuse   . Umbilical hernia     No family history on file.  Past Surgical History:  Procedure Laterality Date  . As video bronchoscopy.  08/04/2007  . Left VATS, left upper lobectomy.  07/31/2007  . TOTAL HIP ARTHROPLASTY Left 01/21/2020   Procedure: LEFT TOTAL HIP ARTHROPLASTY-DIRECT ANTERIOR;  Surgeon: Marybelle Killings, MD;  Location: [place];  Service: Orthopedics;  Laterality: Left;  . TUBAL LIGATION  1994   Social History   Occupational History  . Not on file  Tobacco Use  . Smoking status: Former Smoker    Types: Cigarettes    Quit date: 10/11/2008    Years since quitting: 11.8  . Smokeless tobacco: Never Used  . Tobacco comment: 01/16/2020: per  patient quit back in 2008  Substance and Sexual Activity  . Alcohol use: Yes    Comment: not on a regular basis  . Drug use: No  . Sexual activity: Not on file

## 2021-07-30 ENCOUNTER — Ambulatory Visit (INDEPENDENT_AMBULATORY_CARE_PROVIDER_SITE_OTHER): Payer: Medicare Other | Admitting: Orthopaedic Surgery

## 2021-07-30 ENCOUNTER — Ambulatory Visit: Payer: Self-pay

## 2021-07-30 ENCOUNTER — Other Ambulatory Visit: Payer: Self-pay

## 2021-07-30 VITALS — Ht 63.0 in | Wt 139.1 lb

## 2021-07-30 DIAGNOSIS — Z96642 Presence of left artificial hip joint: Secondary | ICD-10-CM

## 2021-07-30 DIAGNOSIS — M25552 Pain in left hip: Secondary | ICD-10-CM | POA: Diagnosis not present

## 2021-07-30 NOTE — Progress Notes (Signed)
Office Visit Note   Patient: Cindy Bullock           Date of Birth: 1951-01-19           MRN: [id number] Visit Date: 07/30/2021              Requested by: Dairl Ponder, MD [place],  [place] PCP: Dairl Ponder, MD   Assessment & Plan: Visit Diagnoses:  1. Hx of total hip arthroplasty, left     Plan: X-rays look good she can continue walking.  She does have some lumbar curvature facet arthropathy.  She can continue doing her daily walking of a mile or more.  X-rays look good hip incision was good.  If she develops increased back pain or radicular symptoms she can return.  I reassured her that x-rays look good of her hip no problems.  Follow-Up Instructions: No follow-ups on file.   Orders:  Orders Placed This Encounter  Procedures   XR HIP UNILAT W OR W/O PELVIS 2-3 VIEWS LEFT   No orders of the defined types were placed in this encounter.     Procedures: No procedures performed   Clinical Data: No additional findings.   Subjective: Chief Complaint  Patient presents with   Left Hip - Pain    HPI patient returns she noticed after she walks a mile a mile and half some days she has some mild discomfort occasionally takes either Tylenol or ibuprofen.  She also has some associated back pain previous x-ray showed 20 degree curvature with mild facet arthropathy.  Patient states her daughter's been reading was there was concern that maybe she had a loose screw in the acetabulum.  She came in and requested x-rays.  Review of Systems All the systems noncontributory no chills no fever.  Objective: Vital Signs: Ht 5\' 3"  (1.6 m)   Wt 139 lb 2 oz (63.1 kg)   BMI 24.64 kg/m   Physical Exam Constitutional:      Appearance: She is well-developed.  HENT:     Head: Normocephalic.     Right Ear: External ear normal.     Left Ear: External ear normal. There is no impacted cerumen.  Eyes:     Pupils: Pupils are equal, round, and reactive to light.   Neck:     Thyroid: No thyromegaly.     Trachea: No tracheal deviation.  Cardiovascular:     Rate and Rhythm: Normal rate.  Pulmonary:     Effort: Pulmonary effort is normal.  Abdominal:     Palpations: Abdomen is soft.  Musculoskeletal:     Cervical back: No rigidity.  Skin:    General: Skin is warm and dry.  Neurological:     Mental Status: She is alert and oriented to person, place, and time.  Psychiatric:        Behavior: Behavior normal.    Ortho Exam hip incision looks good proximally she has slight dimpling even where clothing fold comes adjacent to the ASIS.  Sensation is intact over the lateral femoral cutaneous nerve.  Distal aspect of the incision also he has slightly depressed.  Scars well-healed no tenderness no increased warmth.  Good range of motion of her hip she is amatory without hip limp.  Specialty Comments:  No specialty comments available.  Imaging: No results found.   PMFS History: Patient Active Problem List   Diagnosis Date Noted   Hx of total hip arthroplasty, left 01/31/2020   Bladder perforation,  intraoperative 01/23/2020   COPD (chronic obstructive pulmonary disease) (HCC)    Lung mass    Past Medical History:  Diagnosis Date   Arthritis of left hip    COPD (chronic obstructive pulmonary disease) (HCC)    Hypertension    Lung mass    lul   SOB (shortness of breath) on exertion    01/16/2020: per patient short of breath sometimes   Tobacco abuse    Umbilical hernia     No family history on file.  Past Surgical History:  Procedure Laterality Date   As video bronchoscopy.  08/04/2007   Left VATS, left upper lobectomy.  07/31/2007   TOTAL HIP ARTHROPLASTY Left 01/21/2020   Procedure: LEFT TOTAL HIP ARTHROPLASTY-DIRECT ANTERIOR;  Surgeon: Marybelle Killings, MD;  Location: [place];  Service: Orthopedics;  Laterality: Left;   TUBAL LIGATION  1994   Social History   Occupational History   Not on file  Tobacco Use   Smoking status: Former     Types: Cigarettes    Quit date: 10/11/2008    Years since quitting: 12.8   Smokeless tobacco: Never   Tobacco comments:    01/16/2020: per patient quit back in 2008  Substance and Sexual Activity   Alcohol use: Yes    Comment: not on a regular basis   Drug use: No   Sexual activity: Not on file

## 2022-02-25 ENCOUNTER — Ambulatory Visit: Payer: Medicare Other | Admitting: Orthopaedic Surgery

## 2022-03-04 ENCOUNTER — Encounter: Payer: Self-pay | Admitting: Orthopaedic Surgery

## 2022-03-04 ENCOUNTER — Ambulatory Visit (INDEPENDENT_AMBULATORY_CARE_PROVIDER_SITE_OTHER): Payer: Medicare Other | Admitting: Orthopaedic Surgery

## 2022-03-04 DIAGNOSIS — M7062 Trochanteric bursitis, left hip: Secondary | ICD-10-CM | POA: Insufficient documentation

## 2022-03-04 NOTE — Progress Notes (Signed)
Office Visit Note   Patient: Cindy Bullock           Date of Birth: 04-25-1951           MRN: [id number] Visit Date: 03/04/2022              Requested by: Dairl Ponder, MD [facility] [place],  [place] PCP: Dairl Ponder, MD   Assessment & Plan: Visit Diagnoses:  1. Trochanteric bursitis, left hip     Plan: Patient had good relief post trochanteric injection.  If she has persistent pain she will let us know.  We discussed with her this may be related to some lumbar pathology which may have flared up with some of her yard work or other activities.  She can return if she gets increasing symptoms.  She noted good relief with the injection.  Follow-Up Instructions: No follow-ups on file.   Orders:  No orders of the defined types were placed in this encounter.  No orders of the defined types were placed in this encounter.     Procedures: Large Joint Inj: L greater trochanter on 03/15/2022 10:13 AM Details: 22 G needle, lateral approach  Arthrogram: No  Medications: 1 mL lidocaine 1 %; 2 mL bupivacaine 0.25 %; 40 mg methylPREDNISolone acetate 40 MG/ML     Clinical Data: No additional findings.   Subjective: Chief Complaint  Patient presents with   Left Hip - Pain    HPI 71 year old female here with lateral left hip pain.  Previous total hip arthroplasty I did on 01/21/2020.  States been doing well until 3 to 4 weeks ago and started having more increased discomfort and problems sleeping on that side as well as slight limping.  She going to the beach next week can.  She is doing yard work yesterday and states it started giving her increased pain.  She has used ibuprofen over-the-counter with slight improvement.  Review of Systems positive for total of arthroplasty 2021.  She is on Lipitor for high cholesterol.  Positive for COPD without dyspnea.  No right hip osteoarthritis.  Negative for chills or fever.   Objective: Vital Signs: Ht 5\' 3"  (1.6 m)   Wt  140 lb (63.5 kg)   BMI 24.80 kg/m   Physical Exam Constitutional:      Appearance: She is well-developed.  HENT:     Head: Normocephalic.     Right Ear: External ear normal.     Left Ear: External ear normal. There is no impacted cerumen.  Eyes:     Pupils: Pupils are equal, round, and reactive to light.  Neck:     Thyroid: No thyromegaly.     Trachea: No tracheal deviation.  Cardiovascular:     Rate and Rhythm: Normal rate.  Pulmonary:     Effort: Pulmonary effort is normal.  Abdominal:     Palpations: Abdomen is soft.  Musculoskeletal:     Cervical back: No rigidity.  Skin:    General: Skin is warm and dry.  Neurological:     Mental Status: She is alert and oriented to person, place, and time.  Psychiatric:        Behavior: Behavior normal.    Ortho Exam patient is sharp tenderness of the left  trochanteric bursa.  Negative logroll the hips.  Hip incisions well-healed.  Good quad strength.  Specialty Comments:  No specialty comments available.  Imaging: No results found.   PMFS History: Patient Active Problem List   Diagnosis  Date Noted   Trochanteric bursitis, left hip 03/04/2022   Hx of total hip arthroplasty, left 01/31/2020   Bladder perforation, intraoperative 01/23/2020   COPD (chronic obstructive pulmonary disease) ([place])    Lung mass    Past Medical History:  Diagnosis Date   Arthritis of left hip    COPD (chronic obstructive pulmonary disease) (HCC)    Hypertension    Lung mass    lul   SOB (shortness of breath) on exertion    01/16/2020: per patient short of breath sometimes   Tobacco abuse    Umbilical hernia     No family history on file.  Past Surgical History:  Procedure Laterality Date   As video bronchoscopy.  08/04/2007   Left VATS, left upper lobectomy.  07/31/2007   TOTAL HIP ARTHROPLASTY Left 01/21/2020   Procedure: LEFT TOTAL HIP ARTHROPLASTY-DIRECT ANTERIOR;  Surgeon: Marybelle Killings, MD;  Location: [facility];  Service: Orthopedics;   Laterality: Left;   TUBAL LIGATION  1994   Social History   Occupational History   Not on file  Tobacco Use   Smoking status: Former    Types: Cigarettes    Quit date: 10/11/2008    Years since quitting: 13.4   Smokeless tobacco: Never   Tobacco comments:    01/16/2020: per patient quit back in 2008  Substance and Sexual Activity   Alcohol use: Yes    Comment: not on a regular basis   Drug use: No   Sexual activity: Not on file

## 2022-03-15 DIAGNOSIS — M7062 Trochanteric bursitis, left hip: Secondary | ICD-10-CM | POA: Diagnosis not present

## 2022-03-15 MED ORDER — METHYLPREDNISOLONE ACETATE 40 MG/ML IJ SUSP
40.0000 mg | INTRAMUSCULAR | Status: AC | PRN
  Administered 2022-03-15: 40 mg via INTRA_ARTICULAR

## 2022-03-15 MED ORDER — LIDOCAINE HCL 1 % IJ SOLN
1.0000 mL | INTRAMUSCULAR | Status: AC | PRN
  Administered 2022-03-15: 1 mL

## 2022-03-15 MED ORDER — BUPIVACAINE HCL 0.25 % IJ SOLN
2.0000 mL | INTRAMUSCULAR | Status: AC | PRN
  Administered 2022-03-15: 2 mL via INTRA_ARTICULAR

## 2022-07-15 ENCOUNTER — Ambulatory Visit (INDEPENDENT_AMBULATORY_CARE_PROVIDER_SITE_OTHER): Payer: Medicare Other

## 2022-07-15 ENCOUNTER — Encounter: Payer: Self-pay | Admitting: Orthopaedic Surgery

## 2022-07-15 ENCOUNTER — Ambulatory Visit (INDEPENDENT_AMBULATORY_CARE_PROVIDER_SITE_OTHER): Payer: Medicare Other | Admitting: Orthopaedic Surgery

## 2022-07-15 VITALS — Ht 63.0 in | Wt 138.0 lb

## 2022-07-15 DIAGNOSIS — M25531 Pain in right wrist: Secondary | ICD-10-CM | POA: Diagnosis not present

## 2022-07-15 NOTE — Progress Notes (Signed)
Office Visit Note   Patient: Cindy Bullock           Date of Birth: 1951/06/24           MRN: [id number] Visit Date: 07/15/2022              Requested by: Dairl Ponder, MD [facility] [place],  [place] PCP: Dairl Ponder, MD   Assessment & Plan: Visit Diagnoses:  1. Pain in right wrist   2.     Ulnar styloid, right nonunion. 3.  ECU tendinopathy, right wrist.  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  We will place in a wrist she can remove it for washing bathing and continue to apply some Aspercreme or Voltaren gel.  If she has persistent symptoms she will let us know.  If her symptoms have resolved she can cancel her appointment.  Follow-Up Instructions: Return in about 6 weeks (around 08/26/2022).   Orders:  Orders Placed This Encounter  Procedures   XR Wrist Complete Right   No orders of the defined types were placed in this encounter.     Procedures: No procedures performed   Clinical Data: No additional findings.   Subjective: Chief Complaint  Patient presents with   Right Wrist - Pain    HPI 71 year old female with right wrist pain burning and throbbing x3 weeks.  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  She denies any past history of injury to the wrist.  She is to be active in sports.  She has used Voltaren gel, blue emu cream, Ace wrap, castor oil all without relief.  She has pain with certain motion.  Hard to brush her teeth wash dishes in the dishwasher.  Previous left total hip arthroplasty 2021 by me doing well with good relief of pain.  She points to the ulnar side of the wrist where she is having the pain.  Review of Systems all the systems noncontributory HPI.   Objective: Vital Signs: Ht 5\' 3"  (1.6 m)   Wt 138 lb (62.6 kg)   BMI 24.45 kg/m   Physical Exam Constitutional:      Appearance: She is well-developed.  HENT:     Head:  Normocephalic.     Right Ear: External ear normal.     Left Ear: External ear normal. There is no impacted cerumen.  Eyes:     Pupils: Pupils are equal, round, and reactive to light.  Neck:     Thyroid: No thyromegaly.     Trachea: No tracheal deviation.  Cardiovascular:     Rate and Rhythm: Normal rate.  Pulmonary:     Effort: Pulmonary effort is normal.  Abdominal:     Palpations: Abdomen is soft.  Musculoskeletal:     Cervical back: No rigidity.  Skin:    General: Skin is warm and dry.  Neurological:     Mental Status: She is alert and oriented to person, place, and time.  Psychiatric:        Behavior: Behavior normal.     Ortho Exam patient has tenderness over the ECU.  Pain with resisted ECU function.  Some tenderness over the TFCC region.  Right carpal tunnel exam is normal good thenar muscle.  Negative Tinel's.  No triggering.  Dorsal compartments 1 through 4 are normal.  Specialty Comments:  No specialty comments available.  Imaging: XR Wrist Complete Right  Result Date: 07/15/2022 Three-view  x-rays right wrist obtained and reviewed.  Patient has ulnar styloid nonunion with intact radius.  No carpal arthritis.  No soft tissue calcification. Impression: Previous ulnar styloid fracture with slight displacement right wrist.  No acute changes.    PMFS History: Patient Active Problem List   Diagnosis Date Noted   Trochanteric bursitis, left hip 03/04/2022   Hx of total hip arthroplasty, left 01/31/2020   Bladder perforation, intraoperative 01/23/2020   COPD (chronic obstructive pulmonary disease) ([place])    Lung mass    Past Medical History:  Diagnosis Date   Arthritis of left hip    COPD (chronic obstructive pulmonary disease) (HCC)    Hypertension    Lung mass    lul   SOB (shortness of breath) on exertion    01/16/2020: per patient short of breath sometimes   Tobacco abuse    Umbilical hernia     No family history on file.  Past Surgical History:   Procedure Laterality Date   As video bronchoscopy.  08/04/2007   Left VATS, left upper lobectomy.  07/31/2007   TOTAL HIP ARTHROPLASTY Left 01/21/2020   Procedure: LEFT TOTAL HIP ARTHROPLASTY-DIRECT ANTERIOR;  Surgeon: Marybelle Killings, MD;  Location: [facility];  Service: Orthopedics;  Laterality: Left;   TUBAL LIGATION  1994   Social History   Occupational History   Not on file  Tobacco Use   Smoking status: Former    Types: Cigarettes    Quit date: 10/11/2008    Years since quitting: 13.7   Smokeless tobacco: Never   Tobacco comments:    01/16/2020: per patient quit back in 2008  Substance and Sexual Activity   Alcohol use: Yes    Comment: not on a regular basis   Drug use: No   Sexual activity: Not on file

## 2022-07-23 ENCOUNTER — Other Ambulatory Visit: Payer: Self-pay | Admitting: Orthopaedic Surgery

## 2022-07-23 ENCOUNTER — Telehealth: Payer: Self-pay | Admitting: Radiology

## 2022-07-23 MED ORDER — TRAMADOL HCL 50 MG PO TABS: 50.0000 mg | ORAL_TABLET | Freq: Two times a day (BID) | ORAL | 0 refills | Status: DC | PRN

## 2022-07-23 NOTE — Telephone Encounter (Signed)
I called patient and advised. 

## 2022-07-23 NOTE — Telephone Encounter (Signed)
Please see message from [place] office below and advise. Thanks.   Pt is having trouble sleeping at night due to the pain in her arm. Can she get something to help her sleep   She uses [facility] on [address] in [place]   Any questions, she can be reached at [phone]

## 2022-08-05 ENCOUNTER — Ambulatory Visit (INDEPENDENT_AMBULATORY_CARE_PROVIDER_SITE_OTHER): Payer: Medicare Other | Admitting: Orthopaedic Surgery

## 2022-08-05 ENCOUNTER — Encounter: Payer: Self-pay | Admitting: Orthopaedic Surgery

## 2022-08-05 DIAGNOSIS — M778 Other enthesopathies, not elsewhere classified: Secondary | ICD-10-CM

## 2022-08-05 MED ORDER — NAPROXEN 500 MG PO TABS: 500.0000 mg | ORAL_TABLET | Freq: Two times a day (BID) | ORAL | 1 refills | Status: DC

## 2022-08-05 NOTE — Progress Notes (Signed)
Office Visit Note   Patient: Cindy Bullock           Date of Birth: 07-24-1951           MRN: [id number] Visit Date: 08/05/2022              Requested by: Dairl Ponder, MD [place],  [place] PCP: Dairl Ponder, MD   Assessment & Plan: Visit Diagnoses:  1. 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  Plan: Continue splint she can apply some extra padding over the distal ulna underneath the splint.  Recheck 5 weeks.  She will apply Aspercreme I will start some Naprosyn 500 mg p.o. twice daily with food.  Follow-Up Instructions: Return in about 5 weeks (around 09/09/2022).   Orders:  No orders of the defined types were placed in this encounter.  Meds ordered this encounter  Medications   naproxen (NAPROSYN) 500 MG tablet    Sig: Take 1 tablet (500 mg total) by mouth 2 (two) times daily with a meal.    Dispense:  60 tablet    Refill:  1      Procedures: No procedures performed   Clinical Data: No additional findings.   Subjective: Chief Complaint  Patient presents with   Right Wrist - Pain    HPI 71 year old female returns with ECU tendinitis and ulnar styloid nonunion without a specific recollection of ever having a significant fall with wrist injury.  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  She has been in a splint seems to be rubbing some.  She has pain with supination pronation and exquisite tenderness with resisted ECU function and along the ECU.  Review of Systems updated unchanged.  Positive for COPD and previous total of arthroplasty doing well.   Objective: Vital Signs: There were no vitals taken for this visit.  Physical Exam Constitutional:      Appearance: She is well-developed.  HENT:     Head: Normocephalic.     Right Ear: External ear normal.     Left Ear: External ear normal. There is no impacted cerumen.  Eyes:      Pupils: Pupils are equal, round, and reactive to light.  Neck:     Thyroid: No thyromegaly.     Trachea: No tracheal deviation.  Cardiovascular:     Rate and Rhythm: Normal rate.  Pulmonary:     Effort: Pulmonary effort is normal.  Abdominal:     Palpations: Abdomen is soft.  Musculoskeletal:     Cervical back: No rigidity.  Skin:    General: Skin is warm and dry.  Neurological:     Mental Status: She is alert and oriented to person, place, and time.  Psychiatric:        Behavior: Behavior normal.     Ortho Exam shows tenderness along the ECU right wrist.  50% supination pronation restricted due to pain.  Sensation fingertips is intact good flexion-extension.  Specialty Comments:  No specialty comments available.  Imaging: No results found.   PMFS History: Patient Active Problem List   Diagnosis Date Noted   Right wrist tendonitis 08/05/2022   Trochanteric bursitis, left hip 03/04/2022   Hx of total hip arthroplasty, left 01/31/2020   Bladder perforation, intraoperative 01/23/2020   COPD (chronic obstructive pulmonary disease) (HCC)    Lung mass    Past  Medical History:  Diagnosis Date   Arthritis of left hip    COPD (chronic obstructive pulmonary disease) (HCC)    Hypertension    Lung mass    lul   SOB (shortness of breath) on exertion    01/16/2020: per patient short of breath sometimes   Tobacco abuse    Umbilical hernia     No family history on file.  Past Surgical History:  Procedure Laterality Date   As video bronchoscopy.  08/04/2007   Left VATS, left upper lobectomy.  07/31/2007   TOTAL HIP ARTHROPLASTY Left 01/21/2020   Procedure: LEFT TOTAL HIP ARTHROPLASTY-DIRECT ANTERIOR;  Surgeon: Marybelle Killings, MD;  Location: [facility];  Service: Orthopedics;  Laterality: Left;   TUBAL LIGATION  1994   Social History   Occupational History   Not on file  Tobacco Use   Smoking status: Former    Types: Cigarettes    Quit date: 10/11/2008    Years since quitting:  13.8   Smokeless tobacco: Never   Tobacco comments:    01/16/2020: per patient quit back in 2008  Substance and Sexual Activity   Alcohol use: Yes    Comment: not on a regular basis   Drug use: No   Sexual activity: Not on file

## 2022-08-26 ENCOUNTER — Ambulatory Visit: Payer: Medicare Other | Admitting: Orthopaedic Surgery

## 2022-09-09 ENCOUNTER — Encounter: Payer: Self-pay | Admitting: Orthopaedic Surgery

## 2022-09-09 ENCOUNTER — Ambulatory Visit (INDEPENDENT_AMBULATORY_CARE_PROVIDER_SITE_OTHER): Payer: Medicare Other | Admitting: Orthopaedic Surgery

## 2022-09-09 VITALS — Ht 63.0 in | Wt 138.0 lb

## 2022-09-09 DIAGNOSIS — M778 Other enthesopathies, not elsewhere classified: Secondary | ICD-10-CM

## 2022-09-09 NOTE — Progress Notes (Signed)
Office Visit Note   Patient: Cindy Bullock           Date of Birth: 1950-12-17           MRN: [id number] Visit Date: 09/09/2022              Requested by: Dairl Ponder, MD [place],  [place] PCP: Dairl Ponder, MD   Assessment & Plan: Visit Diagnoses:  1. 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  ECU tendinopathy moderately improved with brace.  We will recheck her in 8 weeks.  She can remove the brace for bathing and mild activities.  If she is having persistent symptoms can consider MRI imaging.  Follow-Up Instructions: No follow-ups on file.   Orders:  No orders of the defined types were placed in this encounter.  No orders of the defined types were placed in this encounter.     Procedures: No procedures performed   Clinical Data: No additional findings.   Subjective: Chief Complaint  Patient presents with   Right Wrist - Pain, Follow-up    HPI 71 year old female returns for right wrist ECU tendinopathy.  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  She is having to use her left hand primarily for feeding.  Pain in her wrist is improved she is using some Naprosyn.  Supination pronation still bothers her.  She is sleeping with the brace on.  She removes the brace for bathing.  Review of Systems updated unchanged.  Postop hip arthroplasty doing well.   Objective: Vital Signs: Ht 5\' 3"  (1.6 m)   Wt 138 lb (62.6 kg)   BMI 24.45 kg/m   Physical Exam Constitutional:      Appearance: She is well-developed.  HENT:     Head: Normocephalic.     Right Ear: External ear normal.     Left Ear: External ear normal. There is no impacted cerumen.  Eyes:     Pupils: Pupils are equal, round, and reactive to light.  Neck:     Thyroid: No thyromegaly.     Trachea: No tracheal deviation.  Cardiovascular:     Rate and Rhythm: Normal rate.  Pulmonary:      Effort: Pulmonary effort is normal.  Abdominal:     Palpations: Abdomen is soft.  Musculoskeletal:     Cervical back: No rigidity.  Skin:    General: Skin is warm and dry.  Neurological:     Mental Status: She is alert and oriented to person, place, and time.  Psychiatric:        Behavior: Behavior normal.     Ortho Exam mild tenderness over the ECU tendon pain with resistance.  No wrist effusion.  Mild tenderness at the base of the thumb first [place] joint.  Scaphoid is normal with palpation.  Specialty Comments:  No specialty comments available.  Imaging: No results found.   PMFS History: Patient Active Problem List   Diagnosis Date Noted   Right wrist tendonitis 08/05/2022   Trochanteric bursitis, left hip 03/04/2022   Hx of total hip arthroplasty, left 01/31/2020   Bladder perforation, intraoperative 01/23/2020   COPD (chronic obstructive pulmonary disease) ([place])    Lung mass    Past Medical History:  Diagnosis Date   Arthritis of left hip    COPD (chronic obstructive pulmonary disease) (HCC)    Hypertension    Lung mass  lul   SOB (shortness of breath) on exertion    01/16/2020: per patient short of breath sometimes   Tobacco abuse    Umbilical hernia     No family history on file.  Past Surgical History:  Procedure Laterality Date   As video bronchoscopy.  08/04/2007   Left VATS, left upper lobectomy.  07/31/2007   TOTAL HIP ARTHROPLASTY Left 01/21/2020   Procedure: LEFT TOTAL HIP ARTHROPLASTY-DIRECT ANTERIOR;  Surgeon: Marybelle Killings, MD;  Location: [facility];  Service: Orthopedics;  Laterality: Left;   TUBAL LIGATION  1994   Social History   Occupational History   Not on file  Tobacco Use   Smoking status: Former    Types: Cigarettes    Quit date: 10/11/2008    Years since quitting: 13.9   Smokeless tobacco: Never   Tobacco comments:    01/16/2020: per patient quit back in 2008  Substance and Sexual Activity   Alcohol use: Yes    Comment: not on a  regular basis   Drug use: No   Sexual activity: Not on file

## 2022-09-24 ENCOUNTER — Other Ambulatory Visit: Payer: Self-pay | Admitting: Orthopaedic Surgery

## 2022-10-28 ENCOUNTER — Encounter: Payer: Self-pay | Admitting: Orthopaedic Surgery

## 2022-10-28 ENCOUNTER — Ambulatory Visit (INDEPENDENT_AMBULATORY_CARE_PROVIDER_SITE_OTHER): Payer: Medicare Other | Admitting: Orthopaedic Surgery

## 2022-10-28 VITALS — Ht 63.0 in | Wt 138.0 lb

## 2022-10-28 DIAGNOSIS — M778 Other enthesopathies, not elsewhere classified: Secondary | ICD-10-CM

## 2022-10-28 NOTE — Progress Notes (Signed)
Office Visit Note   Patient: Cindy Bullock           Date of Birth: 02/21/1951           MRN: [id number] Visit Date: 10/28/2022              Requested by: Dairl Ponder, MD [place],  [place] PCP: Dairl Ponder, MD   Assessment & Plan: Visit Diagnoses:  1. Right wrist tendonitis     Plan: Patient is clinically improved she can gradually wean herself out of the splint wearing half the time and then progress from there if she has recurrent problems we will proceed with wrist MRI imaging.  Follow-Up Instructions: No follow-ups on file.   Orders:  No orders of the defined types were placed in this encounter.  No orders of the defined types were placed in this encounter.     Procedures: No procedures performed   Clinical Data: No additional findings.   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  She is not wearing a splint she still notices a little swelling in her fingers which can still flex her fingertips to distal palmar crease.  She is noticing improvement in her symptoms with the splint and anti-inflammatories.  She has had symptoms since November.  She still does not recall ever injuring the wrist could be associated with the ulnar styloid fracture.  Review of Systems all other systems are noncontributory HPI and updated.  COVID continues to do well.   Objective: Vital Signs: Ht 5\' 3"  (1.6 m)   Wt 138 lb (62.6 kg)   BMI 24.45 kg/m   Physical Exam Constitutional:      Appearance: She is well-developed.  HENT:     Head: Normocephalic.     Right Ear: External ear normal.     Left Ear: External ear normal. There is no impacted cerumen.  Eyes:     Pupils: Pupils are equal, round, and reactive to light.  Neck:     Thyroid: No thyromegaly.     Trachea: No tracheal deviation.  Cardiovascular:     Rate and  Rhythm: Normal rate.  Pulmonary:     Effort: Pulmonary effort is normal.  Abdominal:     Palpations: Abdomen is soft.  Musculoskeletal:     Cervical back: No rigidity.  Skin:    General: Skin is warm and dry.  Neurological:     Mental Status: She is alert and oriented to person, place, and time.  Psychiatric:        Behavior: Behavior normal.     Ortho Exam Minimal tenderness over the ECU tendon.  Full flexion fingertips to distal palmar crease she may have trace edema in the ulnar 3 fingers.  Ulnar intrinsics are strong.  No tenderness over the metacarpals.  Specialty Comments:  No specialty comments available.  Imaging: No results found.   PMFS History: Patient Active Problem List   Diagnosis Date Noted   Right wrist tendonitis 08/05/2022   Trochanteric bursitis, left hip 03/04/2022   Hx of total hip arthroplasty, left 01/31/2020   Bladder perforation, intraoperative 01/23/2020   COPD (chronic obstructive pulmonary disease) ([place])    Lung mass    Past Medical History:  Diagnosis Date   Arthritis of left hip    COPD (chronic obstructive pulmonary disease) ([place])    Hypertension  Lung mass    lul   SOB (shortness of breath) on exertion    01/16/2020: per patient short of breath sometimes   Tobacco abuse    Umbilical hernia     No family history on file.  Past Surgical History:  Procedure Laterality Date   As video bronchoscopy.  08/04/2007   Left VATS, left upper lobectomy.  07/31/2007   TOTAL HIP ARTHROPLASTY Left 01/21/2020   Procedure: LEFT TOTAL HIP ARTHROPLASTY-DIRECT ANTERIOR;  Surgeon: Eldred Manges, MD;  Location: [facility];  Service: Orthopedics;  Laterality: Left;   TUBAL LIGATION  1994   Social History   Occupational History   Not on file  Tobacco Use   Smoking status: Former    Types: Cigarettes    Quit date: 10/11/2008    Years since quitting: 14.0   Smokeless tobacco: Never   Tobacco comments:    01/16/2020: per patient quit back in 2008   Substance and Sexual Activity   Alcohol use: Yes    Comment: not on a regular basis   Drug use: No   Sexual activity: Not on file

## 2022-11-11 ENCOUNTER — Ambulatory Visit: Payer: Medicare Other | Admitting: Orthopaedic Surgery

## 2023-11-12 DIAGNOSIS — R918 Other nonspecific abnormal finding of lung field: Secondary | ICD-10-CM

## 2023-11-12 HISTORY — DX: Other nonspecific abnormal finding of lung field: R91.8

## 2023-12-14 ENCOUNTER — Encounter: Payer: Self-pay | Admitting: Thoracic Surgery (Cardiothoracic Vascular Surgery)

## 2023-12-14 ENCOUNTER — Institutional Professional Consult (permissible substitution) (INDEPENDENT_AMBULATORY_CARE_PROVIDER_SITE_OTHER): Payer: Medicare Other | Admitting: Thoracic Surgery (Cardiothoracic Vascular Surgery)

## 2023-12-14 ENCOUNTER — Other Ambulatory Visit: Payer: Self-pay | Admitting: Thoracic Surgery (Cardiothoracic Vascular Surgery)

## 2023-12-14 VITALS — BP 155/100 | HR 105 | Resp 18 | Ht 63.0 in | Wt 139.0 lb

## 2023-12-14 DIAGNOSIS — R918 Other nonspecific abnormal finding of lung field: Secondary | ICD-10-CM

## 2023-12-14 NOTE — Progress Notes (Signed)
 PCP is Craig Staggers, MD Referring Provider is O'Corragain, Donzetta Sprung, MD  Chief Complaint  Patient presents with   Lung Lesion    Review work up    HPI: Cindy Bullock is sent for consultation regarding a left lower lobe lung mass.  Cindy Bullock is a 73 year old woman with a history of tobacco use, COPD, lung cancer, hypertension, hyperlipidemia, hypothyroidism, arthritis, coronary calcification, and arthritis.  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  She was followed with CT scans for 5 years and then has been followed with an annual chest x-ray ever since.  On her most recent chest x-ray she was found to have a new nodule in the left lung.  That led to a CT of the chest which showed a 2.1 cm spiculated mass in the left lower lobe.  There is some soft tissue thickening of the pleura of the chest wall adjacent to the nodule but no rib destruction.  PET/CT showed the nodule was hypermetabolic and SUV of 8.5.  The adjacent pleural thickening was not hypermetabolic.  No evidence of regional or distant metastatic disease.  She is retired.  She is fairly active.  She does not have shortness of breath with routine activities.  Does get a little short of breath walking up a flight of stairs but is able to complete that.  No change in appetite or weight loss.  No unusual headaches or visual changes.  30-pack-year history of smoking prior to quitting 17 years ago.  Zubrod Score: At the time of surgery this patient's most appropriate activity status/level should be described as: []     0    Normal activity, no symptoms [x]     1    Restricted in physical strenuous activity but ambulatory, able to do out light work []     2    Ambulatory and capable of self care, unable to do work activities, up and about >50 % of waking hours                              []     3    Only limited self care, in bed greater than 50% of waking hours []     4    Completely disabled, no  self care, confined to bed or chair []     5    Moribund  Past Medical History:  Diagnosis Date   Arthritis of left hip    COPD (chronic obstructive pulmonary disease) (HCC)    Hypertension    Lung mass    lul   SOB (shortness of breath) on exertion    01/16/2020: per patient short of breath sometimes   Tobacco abuse    Umbilical hernia     Past Surgical History:  Procedure Laterality Date   As video bronchoscopy.  08/04/2007   Left VATS, left upper lobectomy.  07/31/2007   TOTAL HIP ARTHROPLASTY Left 01/21/2020   Procedure: LEFT TOTAL HIP ARTHROPLASTY-DIRECT ANTERIOR;  Surgeon: Eldred Manges, MD;  Location: [facility];  Service: Orthopedics;  Laterality: Left;   TUBAL LIGATION  1994    History reviewed. No pertinent family history.  Social History Social History   Tobacco Use   Smoking status: Former    Current packs/day: 0.00    Types: Cigarettes    Quit date: 10/11/2008    Years since quitting: 15.1   Smokeless tobacco: Never   Tobacco  comments:    01/16/2020: per patient quit back in 2008  Substance Use Topics   Alcohol use: Yes    Comment: not on a regular basis   Drug use: No    Current Outpatient Medications  Medication Sig Dispense Refill   atorvastatin (LIPITOR) 10 MG tablet Take 10 mg by mouth daily.     levothyroxine (SYNTHROID, LEVOTHROID) 50 MCG tablet Take 50 mcg by mouth daily before breakfast.      lisinopril (ZESTRIL) 20 MG tablet Take 10 mg by mouth daily.      No current facility-administered medications for this visit.    Not on File  Review of Systems  Constitutional:  Negative for activity change, appetite change, fatigue and unexpected weight change.  HENT:  Negative for trouble swallowing and voice change.   Respiratory:  Positive for shortness of breath (With exertion). Negative for cough.   Cardiovascular:  Negative for chest pain.  Genitourinary:  Negative for difficulty urinating and dysuria.  Musculoskeletal:  Positive for arthralgias.   Neurological:  Negative for seizures and weakness.  Hematological:  Negative for adenopathy. Does not bruise/bleed easily.    BP (!) 155/100   Pulse (!) 105   Resp 18   Ht 5\' 3"  (1.6 m)   Wt 139 lb (63 kg)   SpO2 92%   BMI 24.62 kg/m  Physical Exam Vitals reviewed.  Constitutional:      General: She is not in acute distress.    Appearance: Normal appearance.  Eyes:     General: No scleral icterus.    Extraocular Movements: Extraocular movements intact.  Cardiovascular:     Rate and Rhythm: Normal rate and regular rhythm.     Heart sounds: Normal heart sounds. No murmur heard. Pulmonary:     Effort: Pulmonary effort is normal. No respiratory distress.     Breath sounds: Normal breath sounds. No wheezing or rales.  Abdominal:     General: There is no distension.     Palpations: Abdomen is soft.  Lymphadenopathy:     Cervical: No cervical adenopathy.  Skin:    General: Skin is warm and dry.     Comments: Well-healed surgical scars left chest  Neurological:     General: No focal deficit present.     Mental Status: She is alert and oriented to person, place, and time.     Cranial Nerves: No cranial nerve deficit.     Motor: No weakness.    Diagnostic Tests: I personally reviewed her CT and PET/CT images.  Findings include prior left upper lobectomy.  Hypermetabolic 2.1 cm spiculated nodule in the left lower lobe.  Thickening of the adjacent pleura.  No evidence of regional or distant metastatic disease.  Coronary calcification.  COPD.  Pulmonary function testing FVC 1.45 (56%) FEV1 0.81 (40%) FEV1 0.93 (46%) postbronchodilator DLCO 10.18 (56%)  6-minute walk test 390 m heart rate went from 95-100 and saturation went from 94% to 92%  Impression: Cindy Bullock is a 73 year old woman with a history of tobacco use, COPD, lung cancer, hypertension, hyperlipidemia, hypothyroidism, arthritis, coronary calcification, and arthritis.  She had a left upper lobectomy by Dr.  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  Now found to have a new 2 cm spiculated nodule in the left lower lobe that is hypermetabolic on PET.  No evidence of regional or distant metastatic disease.  Differential diagnosis includes infectious and inflammatory nodules, but this has to be considered a new primary bronchogenic carcinoma and less  to be proven otherwise.  Clinical stage would be T1, N0, stage Ia versus T3 N0, stage IIb depending on whether there is actually any chest wall involvement.  She really looks great.  She is very functional.  She did well with her 6-minute walk test.  However I do not think she has adequate pulmonary reserve to tolerate a completion pneumonectomy on the left.  I think her quality of life would be unacceptable.  Options would include a more limited resection versus stereotactic radiation.  Even doing a limited resection would require removing a significant portion of her left lower lobe.  She is interested in not doing surgery if she can avoid it.  I recommended that we do a robotic navigational bronchoscopy to biopsy the nodule.  She could then be referred to radiation oncology for consultation.  She would prefer to do that here rather than in [place].  I would be happy to make a referral to either place.  I discussed the proposed procedure with her.  The plan would be to do a robotic navigational bronchoscopy under general anesthesia.  We would do this on an outpatient basis.  I informed her the general nature of the procedure.  I informed her of the indications, risks, benefits, alternatives, and limitations.  She understands there is no guarantee of a definitive diagnosis.  She understands the risks include, but not limited to those associated with general anesthesia such as MI or DVT, as well as procedure specific risks such as pneumothorax or bleeding.  She accepts the risks and agrees to proceed.  Plan: Navigational bronchoscopy.  Will call to  schedule Will need CT with super D protocol for planning.  Loreli Slot, MD [facility] [phone]

## 2023-12-14 NOTE — H&P (View-Only) (Signed)
 PCP is Craig Staggers, MD Referring Provider is O'Corragain, Donzetta Sprung, MD  Chief Complaint  Patient presents with   Lung Lesion    Review work up    HPI: [name] is sent for consultation regarding a left lower lobe lung mass.  Cindy Bullock is a 73 year old woman with a history of tobacco use, COPD, lung cancer, hypertension, hyperlipidemia, hypothyroidism, arthritis, coronary calcification, and arthritis.  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  She was followed with CT scans for 5 years and then has been followed with an annual chest x-ray ever since.  On her most recent chest x-ray she was found to have a new nodule in the left lung.  That led to a CT of the chest which showed a 2.1 cm spiculated mass in the left lower lobe.  There is some soft tissue thickening of the pleura of the chest wall adjacent to the nodule but no rib destruction.  PET/CT showed the nodule was hypermetabolic and SUV of 8.5.  The adjacent pleural thickening was not hypermetabolic.  No evidence of regional or distant metastatic disease.  She is retired.  She is fairly active.  She does not have shortness of breath with routine activities.  Does get a little short of breath walking up a flight of stairs but is able to complete that.  No change in appetite or weight loss.  No unusual headaches or visual changes.  30-pack-year history of smoking prior to quitting 17 years ago.  Zubrod Score: At the time of surgery this patient's most appropriate activity status/level should be described as: []     0    Normal activity, no symptoms [x]     1    Restricted in physical strenuous activity but ambulatory, able to do out light work []     2    Ambulatory and capable of self care, unable to do work activities, up and about >50 % of waking hours                              []     3    Only limited self care, in bed greater than 50% of waking hours []     4    Completely disabled, no  self care, confined to bed or chair []     5    Moribund  Past Medical History:  Diagnosis Date   Arthritis of left hip    COPD (chronic obstructive pulmonary disease) (HCC)    Hypertension    Lung mass    lul   SOB (shortness of breath) on exertion    01/16/2020: per patient short of breath sometimes   Tobacco abuse    Umbilical hernia     Past Surgical History:  Procedure Laterality Date   As video bronchoscopy.  08/04/2007   Left VATS, left upper lobectomy.  07/31/2007   TOTAL HIP ARTHROPLASTY Left 01/21/2020   Procedure: LEFT TOTAL HIP ARTHROPLASTY-DIRECT ANTERIOR;  Surgeon: Eldred Manges, MD;  Location: [facility] OR;  Service: Orthopedics;  Laterality: Left;   TUBAL LIGATION  1994    History reviewed. No pertinent family history.  Social History Social History   Tobacco Use   Smoking status: Former    Current packs/day: 0.00    Types: Cigarettes    Quit date: 10/11/2008    Years since quitting: 15.1   Smokeless tobacco: Never   Tobacco  comments:    01/16/2020: per patient quit back in 2008  Substance Use Topics   Alcohol use: Yes    Comment: not on a regular basis   Drug use: No    Current Outpatient Medications  Medication Sig Dispense Refill   atorvastatin (LIPITOR) 10 MG tablet Take 10 mg by mouth daily.     levothyroxine (SYNTHROID, LEVOTHROID) 50 MCG tablet Take 50 mcg by mouth daily before breakfast.      lisinopril (ZESTRIL) 20 MG tablet Take 10 mg by mouth daily.      No current facility-administered medications for this visit.    Not on File  Review of Systems  Constitutional:  Negative for activity change, appetite change, fatigue and unexpected weight change.  HENT:  Negative for trouble swallowing and voice change.   Respiratory:  Positive for shortness of breath (With exertion). Negative for cough.   Cardiovascular:  Negative for chest pain.  Genitourinary:  Negative for difficulty urinating and dysuria.  Musculoskeletal:  Positive for arthralgias.   Neurological:  Negative for seizures and weakness.  Hematological:  Negative for adenopathy. Does not bruise/bleed easily.    BP (!) 155/100   Pulse (!) 105   Resp 18   Ht 5\' 3"  (1.6 m)   Wt 139 lb (63 kg)   SpO2 92%   BMI 24.62 kg/m  Physical Exam Vitals reviewed.  Constitutional:      General: She is not in acute distress.    Appearance: Normal appearance.  Eyes:     General: No scleral icterus.    Extraocular Movements: Extraocular movements intact.  Cardiovascular:     Rate and Rhythm: Normal rate and regular rhythm.     Heart sounds: Normal heart sounds. No murmur heard. Pulmonary:     Effort: Pulmonary effort is normal. No respiratory distress.     Breath sounds: Normal breath sounds. No wheezing or rales.  Abdominal:     General: There is no distension.     Palpations: Abdomen is soft.  Lymphadenopathy:     Cervical: No cervical adenopathy.  Skin:    General: Skin is warm and dry.     Comments: Well-healed surgical scars left chest  Neurological:     General: No focal deficit present.     Mental Status: She is alert and oriented to person, place, and time.     Cranial Nerves: No cranial nerve deficit.     Motor: No weakness.    Diagnostic Tests: I personally reviewed her CT and PET/CT images.  Findings include prior left upper lobectomy.  Hypermetabolic 2.1 cm spiculated nodule in the left lower lobe.  Thickening of the adjacent pleura.  No evidence of regional or distant metastatic disease.  Coronary calcification.  COPD.  Pulmonary function testing FVC 1.45 (56%) FEV1 0.81 (40%) FEV1 0.93 (46%) postbronchodilator DLCO 10.18 (56%)  6-minute walk test 390 m heart rate went from 95-100 and saturation went from 94% to 92%  Impression: Cindy Bullock is a 73 year old woman with a history of tobacco use, COPD, lung cancer, hypertension, hyperlipidemia, hypothyroidism, arthritis, coronary calcification, and arthritis.  She had a left upper lobectomy by Dr.  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  Now found to have a new 2 cm spiculated nodule in the left lower lobe that is hypermetabolic on PET.  No evidence of regional or distant metastatic disease.  Differential diagnosis includes infectious and inflammatory nodules, but this has to be considered a new primary bronchogenic carcinoma and less  to be proven otherwise.  Clinical stage would be T1, N0, stage Ia versus T3 N0, stage IIb depending on whether there is actually any chest wall involvement.  She really looks great.  She is very functional.  She did well with her 6-minute walk test.  However I do not think she has adequate pulmonary reserve to tolerate a completion pneumonectomy on the left.  I think her quality of life would be unacceptable.  Options would include a more limited resection versus stereotactic radiation.  Even doing a limited resection would require removing a significant portion of her left lower lobe.  She is interested in not doing surgery if she can avoid it.  I recommended that we do a robotic navigational bronchoscopy to biopsy the nodule.  She could then be referred to radiation oncology for consultation.  She would prefer to do that here rather than in [place].  I would be happy to make a referral to either place.  I discussed the proposed procedure with her.  The plan would be to do a robotic navigational bronchoscopy under general anesthesia.  We would do this on an outpatient basis.  I informed her the general nature of the procedure.  I informed her of the indications, risks, benefits, alternatives, and limitations.  She understands there is no guarantee of a definitive diagnosis.  She understands the risks include, but not limited to those associated with general anesthesia such as MI or DVT, as well as procedure specific risks such as pneumothorax or bleeding.  She accepts the risks and agrees to proceed.  Plan: Navigational bronchoscopy.  Will call to  schedule Will need CT with super D protocol for planning.  Loreli Slot, MD [facility] [phone]

## 2023-12-15 ENCOUNTER — Other Ambulatory Visit: Payer: Self-pay | Admitting: *Deleted

## 2023-12-15 DIAGNOSIS — Z5181 Encounter for therapeutic drug level monitoring: Secondary | ICD-10-CM

## 2023-12-15 DIAGNOSIS — R918 Other nonspecific abnormal finding of lung field: Secondary | ICD-10-CM

## 2023-12-16 ENCOUNTER — Encounter: Payer: Self-pay | Admitting: *Deleted

## 2023-12-26 ENCOUNTER — Encounter (HOSPITAL_COMMUNITY): Payer: Self-pay

## 2023-12-26 NOTE — Progress Notes (Signed)
 Surgical Instructions   Your procedure is scheduled on Thursday December 29, 2023. Report to [facility] Main Entrance "A" at 9:15 A.M., then check in with the Admitting office. Any questions or running late day of surgery: call [phone]  Questions prior to your surgery date: call [phone], Monday-Friday, 8am-4pm. If you experience any cold or flu symptoms such as cough, fever, chills, shortness of breath, etc. between now and your scheduled surgery, please notify us at the above number.    Remember:  Do not eat or drink after midnight the night before your surgery   Take these medicines the morning of surgery with A SIP OF WATER  atorvastatin (LIPITOR)  levothyroxine (SYNTHROID, LEVOTHROID)    One week prior to surgery, STOP taking any Aspirin (unless otherwise instructed by your surgeon) Aleve, Naproxen, Ibuprofen, Motrin, Advil, Goody's, BC's, all herbal medications, fish oil, and non-prescription vitamins.                     Do NOT Smoke (Tobacco/Vaping) for 24 hours prior to your procedure.  If you use a CPAP at night, you may bring your mask/headgear for your overnight stay.   You will be asked to remove any contacts, glasses, piercing's, hearing aid's, dentures/partials prior to surgery. Please bring cases for these items if needed.    Patients discharged the day of surgery will not be allowed to drive home, and someone needs to stay with them for 24 hours.  SURGICAL WAITING ROOM VISITATION Patients may have no more than 2 support people in the waiting area - these visitors may rotate.   Pre-op nurse will coordinate an appropriate time for 1 ADULT support person, who may not rotate, to accompany patient in pre-op.  Children under the age of 100 must have an adult with them who is not the patient and must remain in the main waiting area with an adult.  If the patient needs to stay at the hospital during part of their recovery, the visitor guidelines for inpatient rooms  apply.  Please refer to the [facility] website for the visitor guidelines for any additional information.   If you received a COVID test during your pre-op visit  it is requested that you wear a mask when out in public, stay away from anyone that may not be feeling well and notify your surgeon if you develop symptoms. If you have been in contact with anyone that has tested positive in the last 10 days please notify you surgeon.      Pre-operative CHG Bathing Instructions   You can play a key role in reducing the risk of infection after surgery. Your skin needs to be as free of germs as possible. You can reduce the number of germs on your skin by washing with CHG (chlorhexidine gluconate) soap before surgery. CHG is an antiseptic soap that kills germs and continues to kill germs even after washing.   DO NOT use if you have an allergy to chlorhexidine/CHG or antibacterial soaps. If your skin becomes reddened or irritated, stop using the CHG and notify one of our RNs at [phone].              TAKE A SHOWER THE NIGHT BEFORE SURGERY AND THE DAY OF SURGERY    Please keep in mind the following:  DO NOT shave, including legs and underarms, 48 hours prior to surgery.   You may shave your face before/day of surgery.  Place clean sheets on your bed the  night before surgery Use a clean washcloth (not used since being washed) for each shower. DO NOT sleep with pet's night before surgery.  CHG Shower Instructions:  Wash your face and private area with normal soap. If you choose to wash your hair, wash first with your normal shampoo.  After you use shampoo/soap, rinse your hair and body thoroughly to remove shampoo/soap residue.  Turn the water OFF and apply half the bottle of CHG soap to a CLEAN washcloth.  Apply CHG soap ONLY FROM YOUR NECK DOWN TO YOUR TOES (washing for 3-5 minutes)  DO NOT use CHG soap on face, private areas, open wounds, or sores.  Pay special attention to the area where  your surgery is being performed.  If you are having back surgery, having someone wash your back for you may be helpful. Wait 2 minutes after CHG soap is applied, then you may rinse off the CHG soap.  Pat dry with a clean towel  Put on clean pajamas    Additional instructions for the day of surgery: DO NOT APPLY any lotions, deodorants or perfumes.   Do not wear jewelry or makeup Do not wear nail polish, gel polish, artificial nails, or any other type of covering on natural nails (fingers and toes) Do not bring valuables to the hospital. [facility] is not responsible for valuables/personal belongings. Put on clean/comfortable clothes.  Please brush your teeth.  Ask your nurse before applying any prescription medications to the skin.

## 2023-12-26 NOTE — Progress Notes (Signed)
 PCP - Dr Sharyn Lull Vasireddy Cardiothoracic - Dr Charlett Lango Pulmonology - Dr Kemper Durie O'Corragain  Chest x-ray - 12/27/23 EKG - 12/27/23 Stress Test - n/a ECHO - n/a Cardiac Cath - n/a  ICD Pacemaker/Loop - n/a  Sleep Study -  n/a  Diabetes - n/a  Aspirin and Blood Thinner Instructions:  n/a  NPO   Anesthesia review: Yes  STOP now taking any Aspirin (unless otherwise instructed by your surgeon), Aleve, Naproxen, Ibuprofen, Motrin, Advil, Goody's, BC's, all herbal medications, fish oil, and all vitamins.   Coronavirus Screening Do you have any of the following symptoms:  Cough yes/no: No Fever (>100.68F)  yes/no: No Runny nose yes/no: No Sore throat yes/no: No Difficulty breathing/shortness of breath  yes/no: No  Have you traveled in the last 14 days and where? yes/no: No  Patient verbalized understanding of instructions that were given to them at the PAT appointment. Patient was also instructed that they will need to review over the PAT instructions again at home before surgery.

## 2023-12-27 ENCOUNTER — Other Ambulatory Visit: Payer: Self-pay

## 2023-12-27 ENCOUNTER — Encounter (HOSPITAL_COMMUNITY): Payer: Self-pay

## 2023-12-27 ENCOUNTER — Ambulatory Visit (HOSPITAL_COMMUNITY)
Admission: RE | Admit: 2023-12-27 | Discharge: 2023-12-27 | Disposition: A | Discharge status: Home / Self Care | Source: Ambulatory Visit | Attending: Thoracic Surgery (Cardiothoracic Vascular Surgery) | Admitting: Thoracic Surgery (Cardiothoracic Vascular Surgery)

## 2023-12-27 ENCOUNTER — Encounter (HOSPITAL_COMMUNITY)
Admission: RE | Admit: 2023-12-27 | Discharge: 2023-12-27 | Disposition: A | Discharge status: Home / Self Care | Source: Ambulatory Visit | Attending: Thoracic Surgery (Cardiothoracic Vascular Surgery)

## 2023-12-27 DIAGNOSIS — Z5181 Encounter for therapeutic drug level monitoring: Secondary | ICD-10-CM

## 2023-12-27 DIAGNOSIS — R918 Other nonspecific abnormal finding of lung field: Secondary | ICD-10-CM | POA: Diagnosis present

## 2023-12-27 HISTORY — DX: Unspecified hearing loss, unspecified ear: H91.90

## 2023-12-27 HISTORY — DX: Hypothyroidism, unspecified: E03.9

## 2023-12-27 HISTORY — DX: Dyspnea, unspecified: R06.00

## 2023-12-27 HISTORY — DX: Pure hypercholesterolemia, unspecified: E78.00

## 2023-12-27 LAB — CBC
HCT: 44.7 % (ref 36.0–46.0)
Hemoglobin: 14.2 g/dL (ref 12.0–15.0)
MCH: 29 pg (ref 26.0–34.0)
MCHC: 31.8 g/dL (ref 30.0–36.0)
MCV: 91.4 fL (ref 80.0–100.0)
Platelets: 197 10*3/uL (ref 150–400)
RBC: 4.89 MIL/uL (ref 3.87–5.11)
RDW: 12.7 % (ref 11.5–15.5)
WBC: 6.7 10*3/uL (ref 4.0–10.5)
nRBC: 0 % (ref 0.0–0.2)

## 2023-12-27 LAB — COMPREHENSIVE METABOLIC PANEL
ALT: 16 U/L (ref 0–44)
AST: 20 U/L (ref 15–41)
Albumin: 4.4 g/dL (ref 3.5–5.0)
Alkaline Phosphatase: 86 U/L (ref 38–126)
Anion gap: 10 (ref 5–15)
BUN: 10 mg/dL (ref 8–23)
CO2: 29 mmol/L (ref 22–32)
Calcium: 9.8 mg/dL (ref 8.9–10.3)
Chloride: 103 mmol/L (ref 98–111)
Creatinine, Ser: 0.84 mg/dL (ref 0.44–1.00)
GFR, Estimated: >60 mL/min (ref >60)
Glucose, Bld: 112 mg/dL — ABNORMAL HIGH (ref 70–99)
Potassium: 4.5 mmol/L (ref 3.5–5.1)
Sodium: 142 mmol/L (ref 135–145)
Total Bilirubin: 0.7 mg/dL (ref 0.0–1.2)
Total Protein: 7.9 g/dL (ref 6.5–8.1)

## 2023-12-27 LAB — PROTIME-INR
INR: 1 (ref 0.8–1.2)
Prothrombin Time: 13.1 s (ref 11.4–15.2)

## 2023-12-27 LAB — APTT: aPTT: 27 s (ref 24–36)

## 2023-12-29 ENCOUNTER — Ambulatory Visit (HOSPITAL_BASED_OUTPATIENT_CLINIC_OR_DEPARTMENT_OTHER): Admitting: Anesthesiology

## 2023-12-29 ENCOUNTER — Other Ambulatory Visit: Payer: Self-pay

## 2023-12-29 ENCOUNTER — Ambulatory Visit (HOSPITAL_COMMUNITY)

## 2023-12-29 ENCOUNTER — Encounter (HOSPITAL_COMMUNITY)
Admission: RE | Disposition: A | Discharge status: Home / Self Care | Payer: Self-pay | Source: Home / Self Care | Attending: Thoracic Surgery (Cardiothoracic Vascular Surgery)

## 2023-12-29 ENCOUNTER — Encounter (HOSPITAL_COMMUNITY): Payer: Self-pay | Admitting: Thoracic Surgery (Cardiothoracic Vascular Surgery)

## 2023-12-29 ENCOUNTER — Ambulatory Visit (HOSPITAL_COMMUNITY)
Admission: RE | Admit: 2023-12-29 | Discharge: 2023-12-29 | Disposition: A | Discharge status: Home / Self Care | Attending: Thoracic Surgery (Cardiothoracic Vascular Surgery) | Admitting: Thoracic Surgery (Cardiothoracic Vascular Surgery)

## 2023-12-29 ENCOUNTER — Ambulatory Visit (HOSPITAL_COMMUNITY): Payer: Self-pay | Admitting: Physician Assistant

## 2023-12-29 DIAGNOSIS — I10 Essential (primary) hypertension: Secondary | ICD-10-CM | POA: Diagnosis not present

## 2023-12-29 DIAGNOSIS — Z902 Acquired absence of lung [part of]: Secondary | ICD-10-CM | POA: Insufficient documentation

## 2023-12-29 DIAGNOSIS — J449 Chronic obstructive pulmonary disease, unspecified: Secondary | ICD-10-CM | POA: Diagnosis not present

## 2023-12-29 DIAGNOSIS — C3432 Malignant neoplasm of lower lobe, left bronchus or lung: Secondary | ICD-10-CM | POA: Diagnosis not present

## 2023-12-29 DIAGNOSIS — R918 Other nonspecific abnormal finding of lung field: Secondary | ICD-10-CM

## 2023-12-29 DIAGNOSIS — Z79899 Other long term (current) drug therapy: Secondary | ICD-10-CM | POA: Insufficient documentation

## 2023-12-29 DIAGNOSIS — Z87891 Personal history of nicotine dependence: Secondary | ICD-10-CM | POA: Insufficient documentation

## 2023-12-29 DIAGNOSIS — E039 Hypothyroidism, unspecified: Secondary | ICD-10-CM

## 2023-12-29 DIAGNOSIS — E785 Hyperlipidemia, unspecified: Secondary | ICD-10-CM | POA: Insufficient documentation

## 2023-12-29 DIAGNOSIS — M1990 Unspecified osteoarthritis, unspecified site: Secondary | ICD-10-CM | POA: Diagnosis not present

## 2023-12-29 DIAGNOSIS — Z79890 Hormone replacement therapy (postmenopausal): Secondary | ICD-10-CM | POA: Insufficient documentation

## 2023-12-29 DIAGNOSIS — R911 Solitary pulmonary nodule: Secondary | ICD-10-CM

## 2023-12-29 DIAGNOSIS — I2510 Atherosclerotic heart disease of native coronary artery without angina pectoris: Secondary | ICD-10-CM | POA: Diagnosis not present

## 2023-12-29 HISTORY — PX: BRONCHIAL NEEDLE ASPIRATION BIOPSY: SHX5106

## 2023-12-29 HISTORY — PX: BRONCHIAL BIOPSY: SHX5109

## 2023-12-29 HISTORY — PX: VIDEO BRONCHOSCOPY WITH ENDOBRONCHIAL NAVIGATION: SHX6175

## 2023-12-29 HISTORY — PX: BRONCHIAL BRUSHINGS: SHX5108

## 2023-12-29 SURGERY — VIDEO BRONCHOSCOPY WITH ENDOBRONCHIAL NAVIGATION
Anesthesia: General

## 2023-12-29 MED ORDER — DEXAMETHASONE SODIUM PHOSPHATE 10 MG/ML IJ SOLN
INTRAMUSCULAR | Status: DC | PRN
  Administered 2023-12-29: 10 mg via INTRAVENOUS

## 2023-12-29 MED ORDER — CHLORHEXIDINE GLUCONATE 0.12 % MT SOLN
15.0000 mL | Freq: Once | OROMUCOSAL | Status: AC
  Administered 2023-12-29: 15 mL via OROMUCOSAL
  Filled 2023-12-29: qty 15

## 2023-12-29 MED ORDER — PHENYLEPHRINE HCL-NACL 20-0.9 MG/250ML-% IV SOLN
INTRAVENOUS | Status: DC | PRN
  Administered 2023-12-29: 30 ug/min via INTRAVENOUS

## 2023-12-29 MED ORDER — ONDANSETRON HCL 4 MG/2ML IJ SOLN: 4.0000 mg | Freq: Once | INTRAMUSCULAR | Status: DC | PRN

## 2023-12-29 MED ORDER — PROPOFOL 10 MG/ML IV BOLUS
INTRAVENOUS | Status: DC | PRN
  Administered 2023-12-29: 100 mg via INTRAVENOUS

## 2023-12-29 MED ORDER — AMISULPRIDE (ANTIEMETIC) 5 MG/2ML IV SOLN: 10.0000 mg | Freq: Once | INTRAVENOUS | Status: DC | PRN

## 2023-12-29 MED ORDER — PHENYLEPHRINE 80 MCG/ML (10ML) SYRINGE FOR IV PUSH (FOR BLOOD PRESSURE SUPPORT)
PREFILLED_SYRINGE | INTRAVENOUS | Status: DC | PRN
  Administered 2023-12-29: 120 ug via INTRAVENOUS
  Administered 2023-12-29: 240 ug via INTRAVENOUS

## 2023-12-29 MED ORDER — LIDOCAINE 2% (20 MG/ML) 5 ML SYRINGE
INTRAMUSCULAR | Status: DC | PRN
  Administered 2023-12-29: 60 mg via INTRAVENOUS

## 2023-12-29 MED ORDER — PROPOFOL 500 MG/50ML IV EMUL
INTRAVENOUS | Status: DC | PRN
Start: 2023-12-29 — End: 2023-12-29
  Administered 2023-12-29: 100 ug/kg/min via INTRAVENOUS

## 2023-12-29 MED ORDER — LACTATED RINGERS IV SOLN: INTRAVENOUS | Status: DC

## 2023-12-29 MED ORDER — ONDANSETRON HCL 4 MG/2ML IJ SOLN
INTRAMUSCULAR | Status: DC | PRN
  Administered 2023-12-29: 4 mg via INTRAVENOUS

## 2023-12-29 MED ORDER — ROCURONIUM BROMIDE 10 MG/ML (PF) SYRINGE
PREFILLED_SYRINGE | INTRAVENOUS | Status: DC | PRN
  Administered 2023-12-29: 50 mg via INTRAVENOUS

## 2023-12-29 MED ORDER — SUGAMMADEX SODIUM 200 MG/2ML IV SOLN
INTRAVENOUS | Status: DC | PRN
  Administered 2023-12-29: 200 mg via INTRAVENOUS

## 2023-12-29 MED ORDER — GLYCOPYRROLATE 0.2 MG/ML IJ SOLN
INTRAMUSCULAR | Status: DC | PRN
Start: 2023-12-29 — End: 2023-12-29
  Administered 2023-12-29: .2 mg via INTRAVENOUS

## 2023-12-29 NOTE — Discharge Instructions (Addendum)
 Do not drive or engage in heavy physical activity for 24 hours.  You may resume normal activities tomorrow.  You may cough up small amounts of blood over the next few days.  You use acetaminophen (tylenol) if needed for discomfort.  Avoid aspirin for 24 hours. You may use an over-the-counter cough medication or throat lozenges if needed.  Call [phone] if you develop chest pain, shortness of breath, fever > 101F or cough up more than a tablespoon blood.  My office will arrange a consultation for you with Radiation Oncology and a follow up with me in about 10 days to 2 weeks

## 2023-12-29 NOTE — Anesthesia Postprocedure Evaluation (Signed)
 Anesthesia Post Note  Patient: Cindy Bullock  Procedure(s) Performed: VIDEO BRONCHOSCOPY WITH ENDOBRONCHIAL NAVIGATION BRONCHOSCOPY, WITH NEEDLE ASPIRATION BIOPSY BRONCHOSCOPY, WITH BRUSH BIOPSY BRONCHOSCOPY, WITH BIOPSY     Patient location during evaluation: PACU Anesthesia Type: General Level of consciousness: awake and alert Pain management: pain level controlled Vital Signs Assessment: post-procedure vital signs reviewed and stable Respiratory status: spontaneous breathing, nonlabored ventilation, respiratory function stable and patient connected to nasal cannula oxygen Cardiovascular status: blood pressure returned to baseline and stable Postop Assessment: no apparent nausea or vomiting Anesthetic complications: no  No notable events documented.  Last Vitals:  Vitals:   12/29/23 1330 12/29/23 1400  BP: 134/72   Pulse: 93 79  Resp: 14 13  Temp:    SpO2: 92% 93%    Last Pain:  Vitals:   12/29/23 1230  TempSrc: Temporal  PainSc: 0-No pain                 Shelton Silvas

## 2023-12-29 NOTE — Anesthesia Preprocedure Evaluation (Addendum)
 Anesthesia Evaluation  Patient identified by MRN, date of birth, ID band Patient awake    Reviewed: Allergy & Precautions, NPO status , Patient's Chart, lab work & pertinent test results  Airway Mallampati: II  TM Distance: >3 FB Neck ROM: Full    Dental  (+) Dental Advisory Given   Pulmonary COPD, former smoker    + decreased breath sounds      Cardiovascular hypertension,  Rhythm:Regular Rate:Normal     Neuro/Psych negative neurological ROS  negative psych ROS   GI/Hepatic negative GI ROS, Neg liver ROS,,,  Endo/Other  Hypothyroidism    Renal/GU negative Renal ROS     Musculoskeletal  (+) Arthritis ,    Abdominal   Peds  Hematology negative hematology ROS (+)   Anesthesia Other Findings   Reproductive/Obstetrics                             Anesthesia Physical Anesthesia Plan  ASA: 3  Anesthesia Plan: General   Post-op Pain Management: Tylenol PO (pre-op)*   Induction: Intravenous  PONV Risk Score and Plan: 3 and Ondansetron, Treatment may vary due to age or medical condition, Midazolam and TIVA  Airway Management Planned: Oral ETT  Additional Equipment: None  Intra-op Plan:   Post-operative Plan: Extubation in OR  Informed Consent: I have reviewed the patients History and Physical, chart, labs and discussed the procedure including the risks, benefits and alternatives for the proposed anesthesia with the patient or authorized representative who has indicated his/her understanding and acceptance.     Dental advisory given  Plan Discussed with: CRNA  Anesthesia Plan Comments:        Anesthesia Quick Evaluation

## 2023-12-29 NOTE — Interval H&P Note (Signed)
 History and Physical Interval Note:  12/29/2023 10:47 AM  Cindy Bullock  has presented today for surgery, with the diagnosis of LLL NODULE.  The various methods of treatment have been discussed with the patient and family. After consideration of risks, benefits and other options for treatment, the patient has consented to  Procedure(s): VIDEO BRONCHOSCOPY WITH ENDOBRONCHIAL NAVIGATION (N/A) as a surgical intervention.  The patient's history has been reviewed, patient examined, no change in status, stable for surgery.  I have reviewed the patient's chart and labs.  Questions were answered to the patient's satisfaction.     Loreli Slot

## 2023-12-29 NOTE — Op Note (Addendum)
 Video Bronchoscopy with Robotic Assisted Bronchoscopic Navigation   Date of Operation: 12/29/2023   Pre-op Diagnosis: Left lower lobe mass  Post-op Diagnosis: Left lower lobe mass  Surgeon: Charlett Lango, MD  Assistants: Raechel Chute, MD  Anesthesia: General endotracheal anesthesia  Operation: Flexible video fiberoptic bronchoscopy with robotic assistance and biopsies.  Estimated Blood Loss: Minimal  Complications: None  Indications and History: Cindy Bullock is a 73 y.o. female with history of lung cancer found to have a new lung nodule.  She was advised to undergo robotic bronchoscopy.  The risks, benefits, complications, treatment options and expected outcomes were discussed with the patient.  The possibilities of pneumothorax, pneumonia, reaction to medication, pulmonary aspiration, perforation of a viscus, bleeding, failure to diagnose a condition and creating a complication requiring transfusion or operation were discussed with the patient who freely signed the consent.    Description of Procedure: The patient was seen in the Preoperative Area, was examined and was deemed appropriate to proceed.  The patient was taken to [facility] endoscopy suite, identified as Cindy Bullock and the procedure verified as Flexible Video Fiberoptic Bronchoscopy.  A Time Out was held and the above information confirmed.   Prior to the date of the procedure a high-resolution CT scan of the chest was performed. Utilizing ION software program a virtual tracheobronchial tree was generated to allow the creation of distinct navigation pathways to the patient's parenchymal abnormalities. After being taken to the operating room general anesthesia was initiated and the patient  was orally intubated. The video fiberoptic bronchoscope was introduced via the endotracheal tube and a general inspection was performed which showed normal right and left lung anatomy, aspiration of the bilateral mainstems was completed to  remove any remaining secretions. Robotic catheter inserted into patient's endotracheal tube.   Target #1 left lower lobe: The distinct navigation pathways prepared prior to this procedure were then utilized to navigate to patient's lesion identified on CT scan. The robotic catheter was secured into place and the vision probe was withdrawn.  Lesion location was approximated using fluoroscopy and a CIOS spin was performed and the target location adjusted.  Under fluoroscopic guidance transbronchial needle brushings, transbronchial needle biopsies, and transbronchial forceps biopsies were performed to be sent for cytology and pathology.  Touch prep of the forceps biopsy was adequate for diagnopsis.    Samples Target #1: 1. Transbronchial needle brushings from LLL 2. Transbronchial Wang needle biopsies from LLL 3. Transbronchial forceps biopsies from LLL    Plans:  The patient will be discharged from the PACU to home when recovered from anesthesia and after chest x-ray is reviewed. We will review the cytology, pathology and microbiology results with the patient when they become available. Outpatient followup will be with Radiation Oncology and me.

## 2023-12-29 NOTE — Anesthesia Procedure Notes (Signed)
 Procedure Name: Intubation Date/Time: 12/29/2023 11:27 AM  Performed by: April Holding, CRNAPre-anesthesia Checklist: Patient identified, Emergency Drugs available, Suction available and Patient being monitored Patient Re-evaluated:Patient Re-evaluated prior to induction Oxygen Delivery Method: Circle System Utilized Preoxygenation: Pre-oxygenation with 100% oxygen Induction Type: IV induction Ventilation: Mask ventilation without difficulty Laryngoscope Size: Miller and 2 Grade View: Grade II Tube type: Oral Tube size: 8.5 mm Number of attempts: 1 Airway Equipment and Method: Stylet Placement Confirmation: ETT inserted through vocal cords under direct vision, positive ETCO2 and breath sounds checked- equal and bilateral Secured at: 21 cm Tube secured with: Tape Dental Injury: Teeth and Oropharynx as per pre-operative assessment

## 2023-12-29 NOTE — Transfer of Care (Signed)
 Immediate Anesthesia Transfer of Care Note  Patient: Lurline Del  Procedure(s) Performed: VIDEO BRONCHOSCOPY WITH ENDOBRONCHIAL NAVIGATION BRONCHOSCOPY, WITH NEEDLE ASPIRATION BIOPSY BRONCHOSCOPY, WITH BRUSH BIOPSY BRONCHOSCOPY, WITH BIOPSY  Patient Location: Endoscopy Unit  Anesthesia Type:General  Level of Consciousness: awake, alert , and oriented  Airway & Oxygen Therapy: Patient Spontanous Breathing  Post-op Assessment: Report given to RN and Post -op Vital signs reviewed and stable  Post vital signs: Reviewed and stable  Last Vitals:  Vitals Value Taken Time  BP 114/71 12/29/23 1230  Temp    Pulse 91 12/29/23 1231  Resp 9 12/29/23 1231  SpO2 87 % 12/29/23 1231  Vitals shown include unfiled device data.  Last Pain:  Vitals:   12/29/23 0928  TempSrc:   PainSc: 0-No pain      Patients Stated Pain Goal: 0 (12/29/23 0928)  Complications: No notable events documented.

## 2024-01-01 ENCOUNTER — Encounter (HOSPITAL_COMMUNITY): Payer: Self-pay | Admitting: Thoracic Surgery (Cardiothoracic Vascular Surgery)

## 2024-01-02 LAB — CYTOLOGY - NON PAP

## 2024-01-05 ENCOUNTER — Other Ambulatory Visit: Payer: Self-pay

## 2024-01-05 NOTE — Progress Notes (Signed)
 The proposed treatment discussed in conference is for discussion purpose only and is not a binding recommendation.  The patients have not been physically examined, or presented with their treatment options.  Therefore, final treatment plans cannot be decided.

## 2024-01-05 NOTE — Progress Notes (Signed)
 Thoracic Location of Tumor / Histology: Left Lower Lobe   Patient presented for surveillance scans.  She is s/p Left Upper Lobectomy in 2008.  CT of the chest showed a 2.1 cm spiculated mass in the left lower lobe.    Biopsies of Left Lower Lobe Lung 12/29/2023   Past/Anticipated interventions by cardiothoracic surgery, if any:  Dr. Dorris Fetch -She really looks great.  She is very functional.  She did well with her 6-minute walk test.   -I do not think she has adequate pulmonary reserve to tolerate a completion pneumonectomy on the left.  I think her quality of life would be unacceptable.   -Options would include a more limited resection versus stereotactic radiation.  Even doing a limited resection would require removing a significant portion of her left lower lobe. -She is interested in not doing surgery if she can avoid it    Past/Anticipated interventions by medical oncology, if any:    Tobacco/Marijuana/Snuff/ETOH use: Former Smoker- quit 2010  Signs/Symptoms Weight changes, if any: none Respiratory complaints, if any: She reports SOB with longer distances and climbing stairs. Hemoptysis, if any: None  Pain issues, if any:  Denies chest pain, pressure, or tightness.  SAFETY ISSUES: Prior radiation? None Pacemaker/ICD? No Possible current pregnancy? Postmenopausal Is the patient on methotrexate? No  Current Complaints / other details:   -Left Upper Lobectomy with Dr. Edwyna Shell 2008

## 2024-01-06 ENCOUNTER — Ambulatory Visit
Admission: RE | Admit: 2024-01-06 | Discharge: 2024-01-06 | Disposition: A | Discharge status: Home / Self Care | Source: Ambulatory Visit | Attending: Radiation Oncology | Admitting: Radiation Oncology

## 2024-01-06 ENCOUNTER — Encounter: Payer: Self-pay | Admitting: Radiation Oncology

## 2024-01-06 VITALS — BP 168/69 | HR 102 | Temp 97.5°F | Resp 18 | Ht 63.0 in | Wt 138.2 lb

## 2024-01-06 DIAGNOSIS — E7800 Pure hypercholesterolemia, unspecified: Secondary | ICD-10-CM | POA: Diagnosis not present

## 2024-01-06 DIAGNOSIS — J449 Chronic obstructive pulmonary disease, unspecified: Secondary | ICD-10-CM | POA: Diagnosis not present

## 2024-01-06 DIAGNOSIS — M129 Arthropathy, unspecified: Secondary | ICD-10-CM | POA: Diagnosis not present

## 2024-01-06 DIAGNOSIS — R9389 Abnormal findings on diagnostic imaging of other specified body structures: Secondary | ICD-10-CM | POA: Insufficient documentation

## 2024-01-06 DIAGNOSIS — E039 Hypothyroidism, unspecified: Secondary | ICD-10-CM | POA: Diagnosis not present

## 2024-01-06 DIAGNOSIS — C3432 Malignant neoplasm of lower lobe, left bronchus or lung: Secondary | ICD-10-CM | POA: Insufficient documentation

## 2024-01-06 DIAGNOSIS — Z79899 Other long term (current) drug therapy: Secondary | ICD-10-CM | POA: Diagnosis not present

## 2024-01-06 DIAGNOSIS — Z79890 Hormone replacement therapy (postmenopausal): Secondary | ICD-10-CM | POA: Diagnosis not present

## 2024-01-06 DIAGNOSIS — K429 Umbilical hernia without obstruction or gangrene: Secondary | ICD-10-CM | POA: Diagnosis not present

## 2024-01-06 DIAGNOSIS — I700 Atherosclerosis of aorta: Secondary | ICD-10-CM | POA: Diagnosis not present

## 2024-01-06 DIAGNOSIS — I10 Essential (primary) hypertension: Secondary | ICD-10-CM | POA: Insufficient documentation

## 2024-01-06 DIAGNOSIS — Z87891 Personal history of nicotine dependence: Secondary | ICD-10-CM | POA: Insufficient documentation

## 2024-01-06 DIAGNOSIS — N6320 Unspecified lump in the left breast, unspecified quadrant: Secondary | ICD-10-CM | POA: Diagnosis not present

## 2024-01-06 NOTE — Progress Notes (Signed)
 Radiation Oncology         [phone]) [phone] ________________________________  Name: Cindy Bullock        MRN: [id number]  Date of Service: 01/06/2024 DOB: 1950-11-17  [id number], Sharyn Lull, MD  Loreli Slot, *     REFERRING PHYSICIAN: Loreli Slot, *   DIAGNOSIS: The encounter diagnosis was Malignant neoplasm of lower lobe of left lung (HCC).   Stage IA3 (cT1c, N0, M0) squamous cell carcinoma, NSCLC, of the left lower lung.  History of stage IA NSCLC of the left upper lung; s/p left upper lobectomy in 2008   HISTORY OF PRESENT ILLNESS: Cindy Bullock is a 73 y.o. female with a past medical history significant for tobacco use, COPD, lung cancer, hyperlipidemia, hypertension coronary calcification seen at the request of Dr. Dorris Fetch for left newly diagnosed lung cancer.  She was diagnosed with stage IA NSCLC adenocarcinoma of the left upper lung in 2008 and underwent a lobectomy under the care of Dr. Cyndra Numbers at that time. She was then followed with CT scans for 5 years and subsequently followed with annual chest x-rays since.  Most recently, she underwent a chest x-ray which demonstrated a new pulmonary nodule in the left lung.  Subsequent CT of the chest on 11/10/2023 showed a 2.1 cm spiculated mass in the periphery left lower lobe with associated soft tissue thickening of the pleura of the chest wall adjacent to the nodule, but no rib destruction. PET on 11/17/2023 demonstrated the left lower lung nodule to be hypermetabolic with an SUV of 8.5.  The adjacent thickening was not hypermetabolic. A 7 mm nodule non-FDG avid within the central left breast was also visualized with recommendations for further evaluation. No evidence of metastatic disease visualized.  She proceeded with bronchoscopy and biopsy of the left lower lobe nodule on 12/29/2023 under the care of Dr. Dorris Fetch.  Pathology demonstrated squamous cell carcinoma. Immunohistochemical stains were positive for p40 and  negative for TTF-1. Patient underwent MRI of the brain on 11/25/2023 to complete staging workup. MRI fortunately demonstrated no evidence of metastatic disease.  She was kindly referred to [place] today to discuss radiation treatment options.  PREVIOUS RADIATION THERAPY: No   PAST MEDICAL HISTORY:  Past Medical History:  Diagnosis Date   Arthritis of left hip    hx   Cancer (HCC) 2008   early stages of lung ca per Dr Karle Plumber   COPD (chronic obstructive pulmonary disease) (HCC)    Dyspnea    with exertion   Hearing loss    some - does not wear hearing aids   High cholesterol    Hypertension    Hypothyroidism    Lung mass 2008   lul   Mass of lower lobe of left lung 11/2023   SOB (shortness of breath) on exertion    01/16/2020: per patient short of breath sometimes   Tobacco abuse    Umbilical hernia        PAST SURGICAL HISTORY: Past Surgical History:  Procedure Laterality Date   As video bronchoscopy.  08/04/2007   BRONCHIAL BIOPSY  12/29/2023   Procedure: BRONCHOSCOPY, WITH BIOPSY;  Surgeon: Loreli Slot, MD;  Location: [facility] ENDOSCOPY;  Service: Thoracic;;   BRONCHIAL BRUSHINGS  12/29/2023   Procedure: BRONCHOSCOPY, WITH BRUSH BIOPSY;  Surgeon: Loreli Slot, MD;  Location: [facility] ENDOSCOPY;  Service: Thoracic;;   BRONCHIAL NEEDLE ASPIRATION BIOPSY  12/29/2023   Procedure: BRONCHOSCOPY, WITH NEEDLE ASPIRATION BIOPSY;  Surgeon: Loreli Slot, MD;  Location: [facility] ENDOSCOPY;  Service: Thoracic;;   COLONOSCOPY     EYE SURGERY Bilateral    cataracts removed   Left VATS, left upper lobectomy.  07/31/2007   TOTAL HIP ARTHROPLASTY Left 01/21/2020   Procedure: LEFT TOTAL HIP ARTHROPLASTY-DIRECT ANTERIOR;  Surgeon: Eldred Manges, MD;  Location: [facility] OR;  Service: Orthopedics;  Laterality: Left;   TUBAL LIGATION  1994   VIDEO BRONCHOSCOPY WITH ENDOBRONCHIAL NAVIGATION N/A 12/29/2023   Procedure: VIDEO BRONCHOSCOPY WITH ENDOBRONCHIAL NAVIGATION;  Surgeon:  Loreli Slot, MD;  Location: [facility] ENDOSCOPY;  Service: Thoracic;  Laterality: N/A;     FAMILY HISTORY: History reviewed. No pertinent family history.   SOCIAL HISTORY:  reports that she quit smoking about 15 years ago. Her smoking use included cigarettes. She has never used smokeless tobacco. She reports current alcohol use. She reports that she does not use drugs.   ALLERGIES: Patient has no known allergies.   MEDICATIONS:  Current Outpatient Medications  Medication Sig Dispense Refill   atorvastatin (LIPITOR) 10 MG tablet Take 10 mg by mouth at bedtime.     levothyroxine (SYNTHROID, LEVOTHROID) 50 MCG tablet Take 50 mcg by mouth daily before breakfast.      lisinopril (ZESTRIL) 20 MG tablet Take 10 mg by mouth daily.      No current facility-administered medications for this encounter.     REVIEW OF SYSTEMS: On review of systems, the patient reports that she is doing well overall. She notes shortness of breath with exertion since her lobectomy, but denies any worsening shortness of breath, cough, of chest pain.      PHYSICAL EXAM:  Wt Readings from Last 3 Encounters:  01/06/24 138 lb 4 oz (62.7 kg)  12/29/23 139 lb (63 kg)  12/27/23 139 lb 6.4 oz (63.2 kg)   Temp Readings from Last 3 Encounters:  01/06/24 (!) 97.5 F (36.4 C) (Temporal)  12/29/23 (!) 97.3 F (36.3 C) (Temporal)  12/27/23 98.1 F (36.7 C) (Oral)   BP Readings from Last 3 Encounters:  01/06/24 (!) 168/69  12/29/23 134/72  12/27/23 (!) 185/77   Pulse Readings from Last 3 Encounters:  01/06/24 (!) 102  12/29/23 79  12/27/23 85   Pain Assessment Pain Score: 0-No pain/10  In general this is a well appearing female in no acute distress. She's alert and oriented x4 and appropriate throughout the examination. Cardiopulmonary assessment is negative for acute distress and she exhibits normal effort.     ECOG = 0  0 - Asymptomatic (Fully active, able to carry on all predisease activities  without restriction)  1 - Symptomatic but completely ambulatory (Restricted in physically strenuous activity but ambulatory and able to carry out work of a light or sedentary nature. For example, light housework, office work)  2 - Symptomatic, <50% in bed during the day (Ambulatory and capable of all self care but unable to carry out any work activities. Up and about more than 50% of waking hours)  3 - Symptomatic, >50% in bed, but not bedbound (Capable of only limited self-care, confined to bed or chair 50% or more of waking hours)  4 - Bedbound (Completely disabled. Cannot carry on any self-care. Totally confined to bed or chair)  5 - Death   Santiago Glad MM, Creech RH, Tormey DC, et al. [phone]). "Toxicity and response criteria of the [facility]". Am. Evlyn Clines. Oncol. 5 (6): 649-55    LABORATORY DATA:  Lab Results  Component Value Date   WBC 6.7 12/27/2023  HGB 14.2 12/27/2023   HCT 44.7 12/27/2023   MCV 91.4 12/27/2023   PLT 197 12/27/2023   Lab Results  Component Value Date   NA 142 12/27/2023   K 4.5 12/27/2023   CL 103 12/27/2023   CO2 29 12/27/2023   Lab Results  Component Value Date   ALT 16 12/27/2023   AST 20 12/27/2023   ALKPHOS 86 12/27/2023   BILITOT 0.7 12/27/2023      RADIOGRAPHY: DG Chest Port 1 View Result Date: 12/29/2023 CLINICAL DATA:  Status post bronchoscopy. EXAM: PORTABLE CHEST 1 VIEW COMPARISON:  December 27, 2023. FINDINGS: Stable cardiomediastinal silhouette. Stable pleural based nodular density is noted laterally in left upper lobe with associated postsurgical change; this is concerning for possible malignancy. Left apical scarring and postsurgical changes as noted as well. Mild mediastinal shift to the left is noted. No pneumothorax is noted. IMPRESSION: Stable pleural based nodular density noted laterally in left upper lobe concerning for malignancy, with associated postsurgical change. Electronically Signed   By: Lupita Raider  M.D.   On: 12/29/2023 14:12   DG C-ARM BRONCHOSCOPY Result Date: 12/29/2023 C-ARM BRONCHOSCOPY: Fluoroscopy was utilized by the requesting physician.  No radiographic interpretation.   CT Super D Chest Wo Contrast Result Date: 12/29/2023 CLINICAL DATA:  Lung mass.  Preoperative planning. EXAM: CT CHEST WITHOUT CONTRAST TECHNIQUE: Multidetector CT imaging of the chest was performed using thin slice collimation for electromagnetic bronchoscopy planning purposes, without intravenous contrast. RADIATION DOSE REDUCTION: This exam was performed according to the departmental dose-optimization program which includes automated exposure control, adjustment of the mA and/or kV according to patient size and/or use of iterative reconstruction technique. COMPARISON:  [facility] center CT 11/10/2023 FINDINGS: Cardiovascular: The heart size is normal. No substantial pericardial effusion. Coronary artery calcification is evident. Mild atherosclerotic calcification is noted in the wall of the thoracic aorta. Mediastinum/Nodes: No mediastinal lymphadenopathy. No evidence for gross hilar lymphadenopathy although assessment is limited by the lack of intravenous contrast on the current study. The esophagus has normal imaging features. There is no axillary lymphadenopathy. Lungs/Pleura: Surgical changes are seen in the left hilum and left lung apex. 2.1 x 1.9 cm peripheral nodule is seen in the left upper lung on image 44/4. Volume loss in the left hemithorax is compatible with prior surgery. No suspicious pulmonary nodule or mass in the right lung. No pleural effusion. Upper Abdomen: Visualized portion of the upper abdomen shows no acute findings. Musculoskeletal: No worrisome lytic or sclerotic osseous abnormality. IMPRESSION: 2.1 x 1.9 cm peripheral left upper lung nodule identified in this patient with a history of previous left lung surgery. Electronically Signed   By: Kennith Center M.D.   On: 12/29/2023 07:21    DG Chest 2 View Result Date: 12/27/2023 CLINICAL DATA:  Preop chest exam.  Lung mass. EXAM: CHEST - 2 VIEW COMPARISON:  Chest CT 11/10/2023 FINDINGS: Again seen volume loss in the left hemithorax with surgical clips at the hilum. Peripheral opacity corresponds to nodular density on CT. The heart is normal in size. No evidence of acute airspace disease. No pleural effusion, pulmonary edema or pneumothorax. No acute osseous findings. IMPRESSION: 1. No acute findings. 2. Volume loss in the left hemithorax with surgical clips at the hilum. Peripheral opacity corresponds to nodular density on CT. Electronically Signed   By: Narda Rutherford M.D.   On: 12/27/2023 17:30       IMPRESSION/PLAN:  1. Stage IA3 (cT1c, N0, M0) squamous cell carcinoma, NSCLC,  of the left lower lung.  We have reviewed this patient's current workup. Imaging demonstrates a 2.1 cm mass with no evidence of metastatic disease.  Biopsy has confirmed this to be squamous cell carcinoma.  She underwent a left upper lobectomy in 2008 for a previously diagnosed lung cancer and is hoping to avoid an additional surgery at this time.  She is a good candidate for stereotactic body radiotherapy (SBRT).  Dr. Mitzi Hansen recommends a 5 fraction course of SBRT to the left lower lung.  Today, I talked to the patient and family about the findings and work-up thus far.  We discussed the natural history of early stage lung cancer and general treatment, highlighting the role of radiotherapy in the management.  We discussed the available radiation techniques, and focused on the details of logistics and delivery.  We reviewed the anticipated acute and late sequelae associated with radiation in this setting.  The patient was encouraged to ask questions that I answered to the best of my ability. A patient consent form was discussed and signed.  We retained a copy for our records.  The patient would like to proceed with radiation and will be scheduled for CT  simulation.   2. Breast nodule  PET scan from 11/17/2023 demonstrated a non-FDG avid 7 mm nodule within the central left breast with the recommendation for further evaluation. Patient states she has had a follow-up mammogram for this and this area is thought to be benign. She will continue with her annual screening mammograms.     In a visit lasting 60 minutes, greater than 50% of the time was spent face to face discussing the patient's condition, in preparation for the discussion, and coordinating the patient's care.   The above documentation reflects my direct findings during this shared patient visit. Please see the separate note by Dr. Mitzi Hansen on this date for the remainder of the patient's plan of care.    Bryan Lemma, PA-C    **Disclaimer: This note was dictated with voice recognition software. Similar sounding words can inadvertently be transcribed and this note may contain transcription errors which may not have been corrected upon publication of note.**

## 2024-01-09 ENCOUNTER — Ambulatory Visit (INDEPENDENT_AMBULATORY_CARE_PROVIDER_SITE_OTHER): Admitting: Thoracic Surgery (Cardiothoracic Vascular Surgery)

## 2024-01-09 VITALS — BP 167/88 | HR 103 | Resp 18 | Ht 63.0 in | Wt 138.0 lb

## 2024-01-09 DIAGNOSIS — R918 Other nonspecific abnormal finding of lung field: Secondary | ICD-10-CM | POA: Diagnosis not present

## 2024-01-09 DIAGNOSIS — Z09 Encounter for follow-up examination after completed treatment for conditions other than malignant neoplasm: Secondary | ICD-10-CM

## 2024-01-09 NOTE — Progress Notes (Signed)
      [address]       [place] [address]             [phone]     HPI: Cindy Bullock returns to discuss the results of her navigational bronchoscopy.  Cindy Bullock is a 73 year old woman with a history of tobacco use and left upper lobectomy for stage Ia adenocarcinoma in 2008.  Recently had a chest x-ray which showed a new lung nodule.  CT showed a 2.1 cm spiculated nodule in the left lower lobe.  Although she has good functional status her pulmonary function testing was not adequate to tolerate a completion pneumonectomy on the left side.  She underwent robotic navigational bronchoscopy on 12/14/2023.  She tolerated the procedure well and went home the same day.  Biopsy showed squamous cell carcinoma.  She did not have any difficulty after the procedure.    Past Medical History:  Diagnosis Date   Arthritis of left hip    hx   Cancer (HCC) 2008   early stages of lung ca per Dr Karle Plumber   COPD (chronic obstructive pulmonary disease) (HCC)    Dyspnea    with exertion   Hearing loss    some - does not wear hearing aids   High cholesterol    Hypertension    Hypothyroidism    Lung mass 2008   lul   Mass of lower lobe of left lung 11/2023   SOB (shortness of breath) on exertion    01/16/2020: per patient short of breath sometimes   Tobacco abuse    Umbilical hernia       Current Outpatient Medications  Medication Sig Dispense Refill   atorvastatin (LIPITOR) 10 MG tablet Take 10 mg by mouth at bedtime.     levothyroxine (SYNTHROID, LEVOTHROID) 50 MCG tablet Take 50 mcg by mouth daily before breakfast.      lisinopril (ZESTRIL) 20 MG tablet Take 10 mg by mouth daily.      No current facility-administered medications for this visit.    Physical Exam BP (!) 167/88 (BP Location: Right Arm)   Pulse (!) 103   Resp 18   Ht 5\' 3"  (1.6 m)   Wt 138 lb (62.6 kg)   SpO2 95%   BMI 24.45 kg/m  Well-appearing 73 year old woman in no acute  distress  Diagnostic Tests: FINAL MICROSCOPIC DIAGNOSIS:  A. LUNG, LLL, FINE NEEDLE ASPIRATION:  - Squamous cell carcinoma   B. LUNG, LLL, BRUSHING:  - Atypical cells present    Impression: Cindy Bullock is a 73 year old former smoker with a history of stage Ia adenocarcinoma of the lung resected in 2008.  Now has a new lung nodule.  Biopsy showed squamous cell carcinoma.  Clinical stage Ia (T1, N0) versus IIB (T3, N0).  Pulmonary function not adequate to tolerate completion pneumonectomy.  Options are more limited resection versus stereotactic radiation.  She prefers stereotactic radiation.  She has already met with Dr. Mitzi Hansen and is set to have a simulation later this week.  Instructed her to also follow-up with Dr. Heron Nay and Dr. Juanda Chance.  Plan: I will be happy to see Cindy Bullock back at anytime in the future if I can be of any assistance with her care  Loreli Slot, MD [facility] [phone]

## 2024-01-12 ENCOUNTER — Ambulatory Visit
Admission: RE | Admit: 2024-01-12 | Discharge: 2024-01-12 | Disposition: A | Discharge status: Home / Self Care | Source: Ambulatory Visit | Attending: Radiation Oncology | Admitting: Radiation Oncology

## 2024-01-12 DIAGNOSIS — Z510 Encounter for antineoplastic radiation therapy: Secondary | ICD-10-CM | POA: Diagnosis present

## 2024-01-12 DIAGNOSIS — C3432 Malignant neoplasm of lower lobe, left bronchus or lung: Secondary | ICD-10-CM | POA: Diagnosis present

## 2024-01-17 DIAGNOSIS — Z510 Encounter for antineoplastic radiation therapy: Secondary | ICD-10-CM | POA: Diagnosis not present

## 2024-01-23 ENCOUNTER — Other Ambulatory Visit: Payer: Self-pay

## 2024-01-23 ENCOUNTER — Ambulatory Visit
Admission: RE | Admit: 2024-01-23 | Discharge: 2024-01-23 | Disposition: A | Discharge status: Home / Self Care | Source: Ambulatory Visit | Attending: Radiation Oncology | Admitting: Radiation Oncology

## 2024-01-23 DIAGNOSIS — Z510 Encounter for antineoplastic radiation therapy: Secondary | ICD-10-CM | POA: Diagnosis not present

## 2024-01-23 LAB — RAD ONC ARIA SESSION SUMMARY
Course Elapsed Days: 0
Plan Fractions Treated to Date: 1
Plan Prescribed Dose Per Fraction: 12 Gy
Plan Total Fractions Prescribed: 5
Plan Total Prescribed Dose: 60 Gy
Reference Point Dosage Given to Date: 12 Gy
Reference Point Session Dosage Given: 12 Gy
Session Number: 1

## 2024-01-24 ENCOUNTER — Ambulatory Visit: Admitting: Radiation Oncology

## 2024-01-25 ENCOUNTER — Other Ambulatory Visit: Payer: Self-pay

## 2024-01-25 ENCOUNTER — Ambulatory Visit
Admission: RE | Admit: 2024-01-25 | Discharge: 2024-01-25 | Disposition: A | Discharge status: Home / Self Care | Source: Ambulatory Visit | Attending: Radiation Oncology | Admitting: Radiation Oncology

## 2024-01-25 DIAGNOSIS — Z510 Encounter for antineoplastic radiation therapy: Secondary | ICD-10-CM | POA: Diagnosis not present

## 2024-01-25 LAB — RAD ONC ARIA SESSION SUMMARY
Course Elapsed Days: 2
Plan Fractions Treated to Date: 2
Plan Prescribed Dose Per Fraction: 12 Gy
Plan Total Fractions Prescribed: 5
Plan Total Prescribed Dose: 60 Gy
Reference Point Dosage Given to Date: 24 Gy
Reference Point Session Dosage Given: 12 Gy
Session Number: 2

## 2024-01-26 ENCOUNTER — Ambulatory Visit: Admitting: Radiation Oncology

## 2024-01-27 ENCOUNTER — Ambulatory Visit
Admission: RE | Admit: 2024-01-27 | Discharge: 2024-01-27 | Disposition: A | Discharge status: Home / Self Care | Source: Ambulatory Visit | Attending: Radiation Oncology | Admitting: Radiation Oncology

## 2024-01-27 ENCOUNTER — Other Ambulatory Visit: Payer: Self-pay

## 2024-01-27 DIAGNOSIS — Z510 Encounter for antineoplastic radiation therapy: Secondary | ICD-10-CM | POA: Diagnosis not present

## 2024-01-27 LAB — RAD ONC ARIA SESSION SUMMARY
Course Elapsed Days: 4
Plan Fractions Treated to Date: 3
Plan Prescribed Dose Per Fraction: 12 Gy
Plan Total Fractions Prescribed: 5
Plan Total Prescribed Dose: 60 Gy
Reference Point Dosage Given to Date: 36 Gy
Reference Point Session Dosage Given: 12 Gy
Session Number: 3

## 2024-01-31 ENCOUNTER — Other Ambulatory Visit: Payer: Self-pay

## 2024-01-31 ENCOUNTER — Ambulatory Visit
Admission: RE | Admit: 2024-01-31 | Discharge: 2024-01-31 | Disposition: A | Discharge status: Home / Self Care | Source: Ambulatory Visit | Attending: Radiation Oncology | Admitting: Radiation Oncology

## 2024-01-31 DIAGNOSIS — Z510 Encounter for antineoplastic radiation therapy: Secondary | ICD-10-CM | POA: Diagnosis not present

## 2024-01-31 LAB — RAD ONC ARIA SESSION SUMMARY
Course Elapsed Days: 8
Plan Fractions Treated to Date: 4
Plan Prescribed Dose Per Fraction: 12 Gy
Plan Total Fractions Prescribed: 5
Plan Total Prescribed Dose: 60 Gy
Reference Point Dosage Given to Date: 48 Gy
Reference Point Session Dosage Given: 12 Gy
Session Number: 4

## 2024-02-02 ENCOUNTER — Other Ambulatory Visit: Payer: Self-pay

## 2024-02-02 ENCOUNTER — Ambulatory Visit
Admission: RE | Admit: 2024-02-02 | Discharge: 2024-02-02 | Disposition: A | Discharge status: Home / Self Care | Source: Ambulatory Visit | Attending: Radiation Oncology | Admitting: Radiation Oncology

## 2024-02-02 DIAGNOSIS — Z510 Encounter for antineoplastic radiation therapy: Secondary | ICD-10-CM | POA: Diagnosis not present

## 2024-02-02 LAB — RAD ONC ARIA SESSION SUMMARY
Course Elapsed Days: 10
Plan Fractions Treated to Date: 5
Plan Prescribed Dose Per Fraction: 12 Gy
Plan Total Fractions Prescribed: 5
Plan Total Prescribed Dose: 60 Gy
Reference Point Dosage Given to Date: 60 Gy
Reference Point Session Dosage Given: 12 Gy
Session Number: 5

## 2024-02-03 NOTE — Radiation Completion Notes (Addendum)
  Radiation Oncology         [phone] ________________________________  Name: Cindy Bullock MRN: [id number]  Date of Service: 02/02/2024  DOB: 08-25-1951  End of Treatment Note  Diagnosis:   Stage IA3, cT1cN0M0, NSCLC, squamous cell carcinoma of the LLL   Intent: Curative     ==========DELIVERED PLANS==========  First Treatment Date: 2024-01-23 Last Treatment Date: 2024-02-02   Plan Name: Lung_L_SBRT Site: Lung, Left Technique: SBRT/SRT-IMRT Mode: Photon Dose Per Fraction: 12 Gy Prescribed Dose (Delivered / Prescribed): 60 Gy / 60 Gy Prescribed Fxs (Delivered / Prescribed): 5 / 5     ==========ON TREATMENT VISIT DATES========== 2024-01-23, 2024-01-25, 2024-01-27, 2024-01-31, 2024-02-02, 2024-02-02    See weekly On Treatment Notes in Epic for details in the Media tab (listed as Progress notes on the On Treatment Visit Dates listed above). The patient tolerated radiation.    The patient will receive a call in about one month from the radiation oncology department. She will have a follow up CT scan of the chest in 6-8 weeks and I will contact her with the results when available.     Shelvia Dick, PAC

## 2024-02-07 ENCOUNTER — Other Ambulatory Visit: Payer: Self-pay | Admitting: Radiation Oncology

## 2024-02-07 DIAGNOSIS — C3432 Malignant neoplasm of lower lobe, left bronchus or lung: Secondary | ICD-10-CM

## 2024-02-08 ENCOUNTER — Telehealth: Payer: Self-pay | Admitting: *Deleted

## 2024-02-08 NOTE — Telephone Encounter (Signed)
 CALLED PATIENT TO INFORM OF CT FOR 03-23-24- ARRIVAL TIME- 3:45 PM @ [facility], NO RESTRICTIONS TO SCAN, SPOKE WITH PATIENT AND SHE IS AWARE OF THIS SCAN AND THE INSTRUCTIONS

## 2024-02-24 ENCOUNTER — Encounter (HOSPITAL_COMMUNITY): Payer: Self-pay | Admitting: Thoracic Surgery (Cardiothoracic Vascular Surgery)

## 2024-02-24 NOTE — Progress Notes (Addendum)
  Radiation Oncology         [phone] ________________________________  Name: Cindy Bullock MRN: [id number]  Date of Service: 02/24/2024  DOB: January 28, 1951  Post Treatment Telephone Note  Diagnosis:  Stage IA3, cT1cN0M0, NSCLC, squamous cell carcinoma of the LLL (as documented in provider EOT note)  The patient was available for call today.   Symptoms of fatigue have improved since completing therapy.  Symptoms of skin changes have improved since completing therapy.  Symptoms of esophagitis have improved since completing therapy.  The patient will follow up w/ Rad/Onc PRN and was encouraged to call if she develops concerns or questions regarding radiation.   This concludes the interaction.  Avery Bodo, LPN

## 2024-02-27 ENCOUNTER — Ambulatory Visit
Admission: RE | Admit: 2024-02-27 | Discharge: 2024-02-27 | Disposition: A | Discharge status: Home / Self Care | Source: Ambulatory Visit | Attending: Internal Medicine | Admitting: Internal Medicine

## 2024-03-23 ENCOUNTER — Ambulatory Visit (HOSPITAL_COMMUNITY)
Admission: RE | Admit: 2024-03-23 | Discharge: 2024-03-23 | Disposition: A | Discharge status: Home / Self Care | Source: Ambulatory Visit | Attending: Radiation Oncology | Admitting: Radiation Oncology

## 2024-03-23 DIAGNOSIS — C3432 Malignant neoplasm of lower lobe, left bronchus or lung: Secondary | ICD-10-CM | POA: Diagnosis present

## 2024-03-23 MED ORDER — IOHEXOL 300 MG/ML  SOLN
75.0000 mL | Freq: Once | INTRAMUSCULAR | Status: AC | PRN
  Administered 2024-03-23: 75 mL via INTRAVENOUS

## 2024-03-26 LAB — POCT I-STAT CREATININE: Creatinine, Ser: 0.9 mg/dL (ref 0.44–1.00)

## 2024-03-28 ENCOUNTER — Telehealth: Payer: Self-pay | Admitting: Radiation Oncology

## 2024-03-28 DIAGNOSIS — C3432 Malignant neoplasm of lower lobe, left bronchus or lung: Secondary | ICD-10-CM

## 2024-03-28 NOTE — Telephone Encounter (Signed)
 I called the patient and reviewed her recent CT scan results and recommendations for her next scan per guidelines in 6 months time. She is in agreement.

## 2024-09-25 ENCOUNTER — Telehealth: Payer: Self-pay | Admitting: *Deleted

## 2024-09-25 NOTE — Telephone Encounter (Signed)
 Called patient to inform of CT for 09-27-24 - arrival time- 9:45 am @ [facility], no restrictions to scan, spoke with patient and she is aware of this scan and the instructions

## 2024-09-27 ENCOUNTER — Ambulatory Visit (HOSPITAL_COMMUNITY)
Admission: RE | Admit: 2024-09-27 | Discharge: 2024-09-27 | Disposition: A | Source: Ambulatory Visit | Attending: Radiation Oncology | Admitting: Radiation Oncology

## 2024-09-27 DIAGNOSIS — R918 Other nonspecific abnormal finding of lung field: Secondary | ICD-10-CM | POA: Insufficient documentation

## 2024-09-27 DIAGNOSIS — C3432 Malignant neoplasm of lower lobe, left bronchus or lung: Secondary | ICD-10-CM | POA: Diagnosis present

## 2024-09-27 LAB — POCT I-STAT CREATININE: Creatinine, Ser: 0.8 mg/dL (ref 0.44–1.00)

## 2024-09-27 MED ORDER — IOHEXOL 300 MG/ML  SOLN
80.0000 mL | Freq: Once | INTRAMUSCULAR | Status: AC | PRN
  Administered 2024-09-27: 10:00:00 80 mL via INTRAVENOUS

## 2024-10-01 ENCOUNTER — Ambulatory Visit: Admitting: Radiation Oncology

## 2024-10-08 ENCOUNTER — Telehealth: Payer: Self-pay | Admitting: Radiation Oncology

## 2024-10-08 DIAGNOSIS — C3432 Malignant neoplasm of lower lobe, left bronchus or lung: Secondary | ICD-10-CM

## 2024-10-08 DIAGNOSIS — I82B11 Acute embolism and thrombosis of right subclavian vein: Secondary | ICD-10-CM

## 2024-10-08 NOTE — Telephone Encounter (Signed)
 I called and reviewed the patient's recent CT scan given her history of early stage lung cancer. We will plan repeat CT in 6 months time. She also had comments of breast nodules in the right breast and has been followed for dense breast tissue and had a diagnostic mammogram this year that the findings didn't persist. She gets her mammograms through her OBGYN Dr. Rosalynn in [place] so I'll send a copy of the notes to his office and she will call to make sure he has reviewed and made recommendations about this and also sees him in February 2026.   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 She is asymptomatic but counseled on the need for urgent evaluation if she developed acute symptoms of pain loss of sensation or shortness of breath. She was offered urgent referral to vein and vascular and is in agreement.       Donald KYM Husband, PAC

## 2024-10-15 ENCOUNTER — Ambulatory Visit: Payer: Self-pay | Admitting: Radiation Oncology

## 2024-10-16 ENCOUNTER — Ambulatory Visit: Admitting: Vascular Surgery

## 2024-10-16 ENCOUNTER — Encounter: Payer: Self-pay | Admitting: Vascular Surgery

## 2024-10-16 VITALS — BP 149/88 | HR 90 | Temp 98.4°F | Resp 24 | Ht 63.0 in | Wt 141.6 lb

## 2024-10-16 DIAGNOSIS — I871 Compression of vein: Secondary | ICD-10-CM | POA: Diagnosis not present

## 2024-10-16 NOTE — Progress Notes (Signed)
 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  Patient has a history of left upper lobectomy in 2008 by Dr. Brantley for lung cancer with 1a adenocarcinoma.  Then found to have a new 2.1 cm spiculated left lung mass 2.1 cm.  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  Now having stereotactic radiation to the left lung.    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  She denies any right arm symptoms including swelling.  She has had no prior DVT in her upper extremity.  She denies any chest wall implants like Port-A-Cath.  Past Medical History:  Diagnosis Date   Arthritis of left hip    hx   Cancer (HCC) 2008   early stages of lung ca per Dr Belvie Brantley   COPD (chronic obstructive pulmonary disease) (HCC)    Dyspnea    with exertion   Hearing loss    some - does not wear hearing aids   High cholesterol    Hypertension    Hypothyroidism    Lung mass 2008   lul   Mass of lower lobe of left lung 11/2023   SOB (shortness of breath) on exertion    01/16/2020: per patient short of breath sometimes   Tobacco abuse    Umbilical hernia     Past Surgical History:  Procedure Laterality Date   As video bronchoscopy.  08/04/2007   BRONCHIAL BIOPSY  12/29/2023   Procedure: BRONCHOSCOPY, WITH BIOPSY;  Surgeon: Kerrin Elspeth BROCKS, MD;  Location: [facility] ENDOSCOPY;  Service: Thoracic;;   BRONCHIAL BRUSHINGS  12/29/2023   Procedure: BRONCHOSCOPY, WITH BRUSH BIOPSY;  Surgeon: Kerrin Elspeth BROCKS, MD;  Location: [facility] ENDOSCOPY;  Service: Thoracic;;    BRONCHIAL NEEDLE ASPIRATION BIOPSY  12/29/2023   Procedure: BRONCHOSCOPY, WITH NEEDLE ASPIRATION BIOPSY;  Surgeon: Kerrin Elspeth BROCKS, MD;  Location: [facility] ENDOSCOPY;  Service: Thoracic;;   COLONOSCOPY     EYE SURGERY Bilateral    cataracts removed   Left VATS, left upper lobectomy.  07/31/2007   TOTAL HIP ARTHROPLASTY Left 01/21/2020   Procedure: LEFT TOTAL HIP ARTHROPLASTY-DIRECT ANTERIOR;  Surgeon: Barbarann Oneil BROCKS, MD;  Location: [facility] OR;  Service: Orthopedics;  Laterality: Left;   TUBAL LIGATION  1994   VIDEO BRONCHOSCOPY WITH ENDOBRONCHIAL NAVIGATION N/A 12/29/2023   Procedure: VIDEO BRONCHOSCOPY WITH ENDOBRONCHIAL NAVIGATION;  Surgeon: Kerrin Elspeth BROCKS, MD;  Location: [facility] ENDOSCOPY;  Service: Thoracic;  Laterality: N/A;    No family history on file.  SOCIAL HISTORY: Social History   Socioeconomic History   Marital status: Married    Spouse name: Not on file   Number of children: Not on file   Years of education: Not on file   Highest education level: Not on file  Occupational History   Not on file  Tobacco Use   Smoking status: Former    Current packs/day: 0.00    Types: Cigarettes    Quit date: 10/11/2008    Years since quitting: 16.0   Smokeless tobacco: Never   Tobacco comments:    01/16/2020: per patient quit back in 2008  Vaping Use  Vaping status: Never Used  Substance and Sexual Activity   Alcohol use: Yes    Comment: occasional wine/mixed drink   Drug use: No   Sexual activity: Not Currently    Birth control/protection: Post-menopausal  Other Topics Concern   Not on file  Social History Narrative   Not on file   Social Drivers of Health   Tobacco Use: Medium Risk (01/06/2024)   Patient History    Smoking Tobacco Use: Former    Smokeless Tobacco Use: Never    Passive Exposure: Not on Actuary Strain: Not on file  Food Insecurity: No Food Insecurity (01/06/2024)   Hunger Vital Sign    Worried About Running Out of Food in the Last Year:  Never true    Ran Out of Food in the Last Year: Never true  Transportation Needs: No Transportation Needs (01/06/2024)   PRAPARE - Administrator, Civil Service (Medical): No    Lack of Transportation (Non-Medical): No  Physical Activity: Not on file  Stress: Not on file  Social Connections: Not on file  Intimate Partner Violence: Not At Risk (01/06/2024)   Humiliation, Afraid, Rape, and Kick questionnaire    Fear of Current or Ex-Partner: No    Emotionally Abused: No    Physically Abused: No    Sexually Abused: No  Depression (PHQ2-9): Low Risk (01/06/2024)   Depression (PHQ2-9)    PHQ-2 Score: 0  Alcohol Screen: Not on file  Housing: Low Risk (01/06/2024)   Housing Stability Vital Sign    Unable to Pay for Housing in the Last Year: No    Number of Times Moved in the Last Year: 0    Homeless in the Last Year: No  Utilities: Not At Risk (01/06/2024)   [facility] Utilities    Threatened with loss of utilities: No  Health Literacy: Not on file    Allergies[1]  Current Outpatient Medications  Medication Sig Dispense Refill   atorvastatin (LIPITOR) 10 MG tablet Take 10 mg by mouth at bedtime.     levothyroxine (SYNTHROID, LEVOTHROID) 50 MCG tablet Take 50 mcg by mouth daily before breakfast.      lisinopril (ZESTRIL) 20 MG tablet Take 10 mg by mouth daily.      No current facility-administered medications for this visit.    REVIEW OF SYSTEMS:  [X]  denotes positive finding, [ ]  denotes negative finding Cardiac  Comments:  Chest pain or chest pressure:    Shortness of breath upon exertion:    Short of breath when lying flat:    Irregular heart rhythm:        Vascular    Pain in calf, thigh, or hip brought on by ambulation:    Pain in feet at night that wakes you up from your sleep:     Blood clot in your veins:    Leg swelling:         Pulmonary    Oxygen at home:    Productive cough:     Wheezing:         Neurologic    Sudden weakness in arms or legs:      Sudden numbness in arms or legs:     Sudden onset of difficulty speaking or slurred speech:    Temporary loss of vision in one eye:     Problems with dizziness:         Gastrointestinal    Blood in stool:     Vomited blood:  Genitourinary    Burning when urinating:     Blood in urine:        Psychiatric    Major depression:         Hematologic    Bleeding problems:    Problems with blood clotting too easily:        Skin    Rashes or ulcers:        Constitutional    Fever or chills:      PHYSICAL EXAM: There were no vitals filed for this visit.  GENERAL: The patient is a well-nourished female, in no acute distress. The vital signs are documented above. CARDIAC: There is a regular rate and rhythm.  VASCULAR:  No right arm swelling 2+ palpable right radial pulse No right chest wall collaterals PULMONARY: No respiratory distress. ABDOMEN: Soft and non-tender. MUSCULOSKELETAL: There are no major deformities or cyanosis. NEUROLOGIC: No focal weakness or paresthesias are detected. SKIN: There are no ulcers or rashes noted. PSYCHIATRIC: The patient has a normal affect.  DATA:   CT reviewed 09/27/2024 with right subclavian vein narrowed  Assessment/Plan:  74 y.o. 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  Patient has a history of left upper lobectomy in 2008 by Dr. Brantley for lung cancer with 1a adenocarcinoma.  Then found to have a new 2.1 cm spiculated left lung mass 2.1 cm.  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  Now having stereotactic radiation to the left lung.    Discussed that I am unclear on the exact etiology for her right subclavian vein narrowing noted on most recent CT.  I initially suspected this was related to the central venous line like a Port-A-Cath for chemotherapy but she has never had any  central venous access and never required chemo for her lung cancer.  She is asymptomatic with no right upper arm swelling and no history of DVT and I would not recommend any other invasive intervention or anticoagulation for this given she remains asymptomatic.  I do not think she has any significant increased risk.  I discussed if she ever develops significant right arm swelling or pain to let us  know and we will evaluate with duplex.  She can follow-up with me as needed.  Certainly in the area for thoracic outlet but no history to suggest thoracic outlet syndrome.   Lonni DOROTHA Gaskins, MD [facility]: [phone]        [1] No Known Allergies  "

## 2025-03-18 ENCOUNTER — Ambulatory Visit (HOSPITAL_COMMUNITY)

## 2025-04-01 ENCOUNTER — Ambulatory Visit: Admitting: Radiation Oncology
